# Patient Record
Sex: Female | Born: 1960 | Race: White | Hispanic: Yes | Smoking: Former smoker
Health system: Southern US, Community
[De-identification: ages and names within clinical notes are randomized; demographics above are authoritative.]

## PROBLEM LIST (undated history)

## (undated) DIAGNOSIS — R002 Palpitations: Secondary | ICD-10-CM

## (undated) DIAGNOSIS — E785 Hyperlipidemia, unspecified: Secondary | ICD-10-CM

## (undated) DIAGNOSIS — E039 Hypothyroidism, unspecified: Secondary | ICD-10-CM

## (undated) DIAGNOSIS — K219 Gastro-esophageal reflux disease without esophagitis: Secondary | ICD-10-CM

## (undated) DIAGNOSIS — I10 Essential (primary) hypertension: Secondary | ICD-10-CM

## (undated) HISTORY — PX: DILATION AND CURETTAGE OF UTERUS: SHX78

## (undated) HISTORY — PX: OTHER SURGICAL HISTORY: SHX169

## (undated) HISTORY — PX: TUBAL LIGATION: SHX77

## (undated) HISTORY — DX: Hyperlipidemia, unspecified: E78.5

---

## 2005-01-15 ENCOUNTER — Encounter: Admission: RE | Admit: 2005-01-15 | Discharge: 2005-01-15 | Payer: Self-pay | Admitting: Family Medicine

## 2005-01-24 ENCOUNTER — Ambulatory Visit: Payer: Self-pay | Admitting: Internal Medicine

## 2005-02-16 ENCOUNTER — Ambulatory Visit: Payer: Self-pay | Admitting: Internal Medicine

## 2007-02-21 ENCOUNTER — Encounter: Admission: RE | Admit: 2007-02-21 | Discharge: 2007-02-21 | Payer: Self-pay | Admitting: Family Medicine

## 2007-05-12 ENCOUNTER — Ambulatory Visit (HOSPITAL_COMMUNITY): Admission: RE | Admit: 2007-05-12 | Discharge: 2007-05-12 | Payer: Self-pay | Admitting: Family Medicine

## 2008-01-22 ENCOUNTER — Ambulatory Visit (HOSPITAL_COMMUNITY): Admission: RE | Admit: 2008-01-22 | Discharge: 2008-01-22 | Payer: Self-pay | Admitting: Obstetrics & Gynecology

## 2008-01-22 ENCOUNTER — Encounter (INDEPENDENT_AMBULATORY_CARE_PROVIDER_SITE_OTHER): Payer: Self-pay | Admitting: Obstetrics & Gynecology

## 2008-06-02 ENCOUNTER — Encounter: Admission: RE | Admit: 2008-06-02 | Discharge: 2008-06-02 | Payer: Self-pay | Admitting: Family Medicine

## 2008-10-25 ENCOUNTER — Emergency Department (HOSPITAL_COMMUNITY): Admission: EM | Admit: 2008-10-25 | Discharge: 2008-10-25 | Payer: Self-pay | Admitting: Emergency Medicine

## 2009-09-28 ENCOUNTER — Encounter: Admission: RE | Admit: 2009-09-28 | Discharge: 2009-09-28 | Payer: Self-pay | Admitting: Family Medicine

## 2009-11-07 ENCOUNTER — Telehealth (INDEPENDENT_AMBULATORY_CARE_PROVIDER_SITE_OTHER): Payer: Self-pay | Admitting: *Deleted

## 2009-11-07 ENCOUNTER — Encounter (INDEPENDENT_AMBULATORY_CARE_PROVIDER_SITE_OTHER): Payer: Self-pay | Admitting: *Deleted

## 2010-05-02 NOTE — Progress Notes (Signed)
  Phone Note Outgoing Call   Call placed by: Clide Cliff RN,  November 07, 2009 12:28 PM Details for Reason: No show for Previsit Summary of Call: Attempted to call pt. regarding her NOS of the previsit appointment scheduled for 8am this morning.  The phone had no ID, thus no message was left.   Letter to be mailed out this afternoon.   Colonoscopy cancelled. Initial call taken by: Clide Cliff RN,  November 07, 2009 12:29 PM

## 2010-05-02 NOTE — Letter (Signed)
Summary: Pre Visit No Show Letter  [facility]  [address] [place], [place] [address]   Phone: [phone]  Fax: [phone]        November 07, 2009 MRN: [id number]    Destiny Goodwin [address] [place], [place]  [address]    Dear Ms. Bala,   We have been unable to reach you by phone concerning the pre-procedure visit that you missed on___8/8/2011________________. For this reason,your procedure scheduled on_8/17/11___________ has been cancelled. Our scheduling staff will gladly assist you with rescheduling your appointments at a more convenient time. Please call our office at [phone] between the hours of 8:00am and 5:00pm, press option #2 to reach an appointment scheduler. Please consider updating your contact numbers at this time so that we can reach you by phone in the future with schedule changes or results.    Thank you,    Clide Cliff RN [facility]

## 2010-08-15 NOTE — Op Note (Signed)
NAMEAYDEE, MCNEW             ACCOUNT NO.:  [id number]   MEDICAL RECORD NO.:  [id number]          PATIENT TYPE:  AMB   LOCATION:  SDC                           FACILITY:  [facility]   PHYSICIAN:  Gerrit Friends. Aldona Bar, M.D.   DATE OF BIRTH:  December 10, 1960   DATE OF PROCEDURE:  01/22/2008  DATE OF DISCHARGE:                               OPERATIVE REPORT   PREOPERATIVE DIAGNOSES:  Abnormal uterine bleeding, fibroid uterus,  polyps in endometrial cavity - suggest an ultrasound.   POSTOPERATIVE DIAGNOSES:  Abnormal uterine bleeding, fibroid uterus,  polyps in endometrial cavity - suggest an ultrasound.   PATHOLOGY:  Pending.   PROCEDURE:  Dilatation and curettage.   SURGEON:  Gerrit Friends. Aldona Bar, MD   ANESTHESIA:  Intravenous conscious sedation plus paracervical block with  1% Xylocaine without epinephrine.   HISTORY:  This 50 year old patient was seen by me for the first time in  the office in September 2009 with complaints of abnormal bleeding.  A  sonohystogram done in February 2009 revealed that the patient had  endometrial polyps in addition to approximately 14 weeks size fibroid  uterus.   The patient is being taken to the operating at this point for dilatation  and curettage to evacuate the endometrial cavity of polyps and look for  abnormal histology.   PROCEDURE:  The patient was taken to the operating where after  satisfactory induction of intravenous conscious sedation, she was  prepped and draped having placed in the modified lithotomy position in  the short Allen stirrups.  She was prepped and draped accordingly and  the bladder was drained of clear urine red rubber catheter in-and-out  fashion.  At this time, a speculum was placed in the vagina and a  paracervical block carried out with approximately 14 mL of 1% Xylocaine  without epinephrine.  Thereafter, the internal os was dilated to a #31  Pratt dilator without difficulty.  The cavity sounded to 12 cm.  Using  the polyp  forceps polypoid like tissue was produced with probing of the  cavity.  Thereafter using a small serrated curette, the cavity was  thoroughly gently and systematically curetted and all specimen was sent  labeled endometrial curettings.   Further probing of the cavity revealed no additional tissue.  At this  time, the procedure was felt to be complete was terminated.  All  instruments were removed and the patient was transported to the recovery  room in satisfactory condition having tolerated the procedure well.  Looking grossly the specimen, there are at least two endometrial polyps  noted.  All was sent labeled appropriately.   The patient will be discharged to home after observation in the PACU  with instructions to return to the office in 3-4 weeks' time.  She will  use Advil or Aleve as needed for cramping and will be given an  instruction sheet at the time of discharge.   Condition on arrival in recovery satisfactory.      Gerrit Friends. Aldona Bar, M.D.  Electronically Signed     RMW/MEDQ  D:  01/22/2008  T:  01/22/2008  Job:  [id number] 

## 2010-08-23 ENCOUNTER — Other Ambulatory Visit (HOSPITAL_COMMUNITY)
Admission: RE | Admit: 2010-08-23 | Discharge: 2010-08-23 | Disposition: A | Discharge status: Home / Self Care | Payer: Self-pay | Source: Ambulatory Visit | Attending: Obstetrics and Gynecology | Admitting: Obstetrics and Gynecology

## 2010-08-23 ENCOUNTER — Other Ambulatory Visit: Payer: Self-pay | Admitting: Obstetrics and Gynecology

## 2010-08-23 DIAGNOSIS — Z124 Encounter for screening for malignant neoplasm of cervix: Secondary | ICD-10-CM | POA: Insufficient documentation

## 2010-10-10 ENCOUNTER — Other Ambulatory Visit: Payer: Self-pay | Admitting: Obstetrics and Gynecology

## 2010-10-24 ENCOUNTER — Encounter (HOSPITAL_COMMUNITY): Payer: Self-pay

## 2010-10-24 ENCOUNTER — Other Ambulatory Visit: Payer: Self-pay

## 2010-10-24 ENCOUNTER — Encounter (HOSPITAL_COMMUNITY)
Admission: RE | Admit: 2010-10-24 | Discharge: 2010-10-24 | Disposition: A | Discharge status: Home / Self Care | Payer: Commercial Managed Care - PPO | Source: Ambulatory Visit | Attending: Obstetrics and Gynecology | Admitting: Obstetrics and Gynecology

## 2010-10-24 HISTORY — DX: Hypothyroidism, unspecified: E03.9

## 2010-10-24 HISTORY — DX: Gastro-esophageal reflux disease without esophagitis: K21.9

## 2010-10-24 HISTORY — DX: Palpitations: R00.2

## 2010-10-24 LAB — CBC
HCT: 34.4 % — ABNORMAL LOW (ref 36.0–46.0)
Hemoglobin: 11.2 g/dL — ABNORMAL LOW (ref 12.0–15.0)
MCH: 25.2 pg — ABNORMAL LOW (ref 26.0–34.0)
MCHC: 32.6 g/dL (ref 30.0–36.0)
MCV: 77.5 fL — ABNORMAL LOW (ref 78.0–100.0)
Platelets: 359 10*3/uL (ref 150–400)
RBC: 4.44 MIL/uL (ref 3.87–5.11)
RDW: 16.2 % — ABNORMAL HIGH (ref 11.5–15.5)
WBC: 7.8 10*3/uL (ref 4.0–10.5)

## 2010-10-24 LAB — BASIC METABOLIC PANEL
BUN: 13 mg/dL (ref 6–23)
CO2: 24 mEq/L (ref 19–32)
Calcium: 8.4 mg/dL (ref 8.4–10.5)
Creatinine, Ser: 0.55 mg/dL (ref 0.50–1.10)
GFR calc Af Amer: >60 mL/min (ref >60)
GFR calc non Af Amer: >60 mL/min (ref >60)
Glucose, Bld: 89 mg/dL (ref 70–99)
Potassium: 4.3 mEq/L (ref 3.5–5.1)

## 2010-10-24 LAB — SURGICAL PCR SCREEN: Staphylococcus aureus: NEGATIVE

## 2010-10-24 NOTE — Patient Instructions (Signed)
20 Holle Sprick  10/24/2010   Your procedure is scheduled on:  11/01/10  Report to [facility] at 0800 AM.  Call this number if you have problems the morning of surgery: [phone]   Remember: bring inhaler to hospital   Do not eat food:After Midnight.  Do not drink clear liquids: After Midnight.  Take these medicines the morning of surgery with A SIP OF WATER: none   Do not wear jewelry, make-up or nail polish.  Do not bring valuables to the hospital.  Contacts, dentures or bridgework may not be worn into surgery.  Leave suitcase in the car. After surgery it may be brought to your room.  For patients admitted to the hospital, checkout time is 11:00 AM the day of discharge.   Patients discharged the day of surgery will not be allowed to drive home.  Name and phone number of your driver: Fredrik Cove- [phone]   Special Instructions: none  Please read over the following fact sheets that you were given: none

## 2010-10-24 NOTE — Anesthesia Preprocedure Evaluation (Addendum)
Anesthesia Evaluation  Name, MR# and DOB Patient awake  General Assessment Comment  Reviewed: Allergy & Precautions, H&P , Patient's Chart, lab work & pertinent test results and reviewed documented beta blocker date and time   Airway Mallampati: II TM Distance: <3 FB Neck ROM: Full    Dental  (+) Teeth Intact   Pulmonary  asthma (no inhaler use since March)  clear to auscultation    Cardiovascular hypertension, Pt. on medications Regular Normal Complaining of left-sided chest pain now in short stay.  No SOB, no diaphoresis.  Spo2 98% on RA, pulse 57, BP 117/96.  Will get EKG and assess.Complaining of left-sided chest pain now in short stay.  No SOB, no diaphoresis.  Spo2 98% on RA, pulse 57, BP 117/96.  Will get EKG and assess.:    Neuro/Psych (+) {AN ROS/MED HX NEURO HEADACHES (+) PSYCHIATRIC DISORDERS, Anxiety, Depression,   GI/Hepatic/Renal (+)  GERD      Endo/Other   (+)  Hypothyroidism (on synthroid),  Abdominal   Musculoskeletal  Hematology   Peds  Reproductive/Obstetrics   Anesthesia Other Findings             Anesthesia Physical Anesthesia Plan  ASA: III  Anesthesia Plan: General   Post-op Pain Management:    Induction: Intravenous  Airway Management Planned: Oral ETT  Additional Equipment:   Intra-op Plan:   Post-operative Plan:   Informed Consent: I have reviewed the patients History and Physical, chart, labs and discussed the procedure including the risks, benefits and alternatives for the proposed anesthesia with the patient or authorized representative who has indicated his/her understanding and acceptance.   Dental advisory given  Plan Discussed with: Surgeon and CRNA  Anesthesia Plan Comments: (Began to complain of left-sided chest pain in short stay.  EKG obtained.  EKG is SB at 55.  No evidence of ischemia.  Patient notes pain is in a specific area of muscle and occurs with  movement.  Is not assoc with SOB, N/V.  VSS.  EKG unchanged from prior on 7/24 except for slower rate.  WIll proceed with surgery.  Jasmine December, MD)       Anesthesia Quick Evaluation

## 2010-11-01 ENCOUNTER — Encounter (HOSPITAL_COMMUNITY)
Admission: RE | Disposition: A | Discharge status: Home / Self Care | Payer: Self-pay | Source: Ambulatory Visit | Attending: Obstetrics and Gynecology

## 2010-11-01 ENCOUNTER — Ambulatory Visit (HOSPITAL_COMMUNITY)
Admission: RE | Admit: 2010-11-01 | Discharge: 2010-11-03 | DRG: 743 | Disposition: A | Discharge status: Home / Self Care | Payer: Commercial Managed Care - PPO | Source: Ambulatory Visit | Attending: Obstetrics and Gynecology | Admitting: Obstetrics and Gynecology

## 2010-11-01 ENCOUNTER — Other Ambulatory Visit: Payer: Self-pay | Admitting: Obstetrics and Gynecology

## 2010-11-01 ENCOUNTER — Encounter (HOSPITAL_COMMUNITY): Payer: Self-pay | Admitting: Registered Nurse

## 2010-11-01 ENCOUNTER — Inpatient Hospital Stay (HOSPITAL_COMMUNITY): Payer: Commercial Managed Care - PPO | Admitting: Anesthesiology

## 2010-11-01 ENCOUNTER — Other Ambulatory Visit: Payer: Self-pay

## 2010-11-01 ENCOUNTER — Encounter (HOSPITAL_COMMUNITY): Payer: Self-pay | Admitting: Anesthesiology

## 2010-11-01 DIAGNOSIS — N393 Stress incontinence (female) (male): Secondary | ICD-10-CM | POA: Diagnosis present

## 2010-11-01 DIAGNOSIS — Z01818 Encounter for other preprocedural examination: Secondary | ICD-10-CM

## 2010-11-01 DIAGNOSIS — N8000 Endometriosis of the uterus, unspecified: Secondary | ICD-10-CM | POA: Diagnosis present

## 2010-11-01 DIAGNOSIS — D251 Intramural leiomyoma of uterus: Principal | ICD-10-CM | POA: Diagnosis present

## 2010-11-01 DIAGNOSIS — N838 Other noninflammatory disorders of ovary, fallopian tube and broad ligament: Secondary | ICD-10-CM | POA: Diagnosis present

## 2010-11-01 DIAGNOSIS — N8300 Follicular cyst of ovary, unspecified side: Secondary | ICD-10-CM | POA: Diagnosis present

## 2010-11-01 DIAGNOSIS — Z01812 Encounter for preprocedural laboratory examination: Secondary | ICD-10-CM

## 2010-11-01 HISTORY — PX: ABDOMINAL HYSTERECTOMY: SHX81

## 2010-11-01 HISTORY — PX: BURCH PROCEDURE: SHX1273

## 2010-11-01 SURGERY — HYSTERECTOMY, ABDOMINAL
Anesthesia: General | Site: Bladder | Wound class: Clean Contaminated

## 2010-11-01 MED ORDER — DEXAMETHASONE SODIUM PHOSPHATE 10 MG/ML IJ SOLN
INTRAMUSCULAR | Status: AC
  Filled 2010-11-01: qty 1

## 2010-11-01 MED ORDER — INDIGOTINDISULFONATE SODIUM 8 MG/ML IJ SOLN
INTRAMUSCULAR | Status: AC
  Filled 2010-11-01: qty 5

## 2010-11-01 MED ORDER — ALUM & MAG HYDROXIDE-SIMETH 200-200-20 MG/5ML PO SUSP: 30.0000 mL | ORAL | Status: DC | PRN

## 2010-11-01 MED ORDER — MIDAZOLAM HCL 2 MG/2ML IJ SOLN
INTRAMUSCULAR | Status: AC
  Filled 2010-11-01: qty 2

## 2010-11-01 MED ORDER — HYDROMORPHONE HCL 1 MG/ML IJ SOLN
INTRAMUSCULAR | Status: DC | PRN
  Administered 2010-11-01: 1 mg via INTRAVENOUS

## 2010-11-01 MED ORDER — NEOSTIGMINE METHYLSULFATE 1 MG/ML IJ SOLN
INTRAMUSCULAR | Status: DC | PRN
  Administered 2010-11-01: 2 mg via INTRAMUSCULAR

## 2010-11-01 MED ORDER — LACTATED RINGERS IV SOLN
INTRAVENOUS | Status: DC
Start: 2010-11-01 — End: 2010-11-01
  Administered 2010-11-01 (×3): via INTRAVENOUS

## 2010-11-01 MED ORDER — FENTANYL CITRATE 0.05 MG/ML IJ SOLN
INTRAMUSCULAR | Status: AC
  Filled 2010-11-01: qty 5

## 2010-11-01 MED ORDER — NEOSTIGMINE METHYLSULFATE 1 MG/ML IJ SOLN
INTRAMUSCULAR | Status: AC
  Filled 2010-11-01: qty 10

## 2010-11-01 MED ORDER — ACETAMINOPHEN 325 MG PO TABS: 325.0000 mg | ORAL_TABLET | ORAL | Status: DC | PRN

## 2010-11-01 MED ORDER — CITALOPRAM HYDROBROMIDE 10 MG PO TABS
10.0000 mg | ORAL_TABLET | Freq: Every day | ORAL | Status: DC
  Filled 2010-11-01 (×2): qty 1

## 2010-11-01 MED ORDER — KCL-LACTATED RINGERS-D5W 20 MEQ/L IV SOLN
INTRAVENOUS | Status: DC
  Filled 2010-11-01 (×2): qty 1000

## 2010-11-01 MED ORDER — INDIGOTINDISULFONATE SODIUM 8 MG/ML IJ SOLN
INTRAMUSCULAR | Status: DC | PRN
  Administered 2010-11-01: 5 mL via INTRAVENOUS

## 2010-11-01 MED ORDER — SIMETHICONE 80 MG PO CHEW: 80.0000 mg | CHEWABLE_TABLET | Freq: Four times a day (QID) | ORAL | Status: DC | PRN

## 2010-11-01 MED ORDER — PROPOFOL 10 MG/ML IV EMUL
INTRAVENOUS | Status: AC
  Filled 2010-11-01: qty 20

## 2010-11-01 MED ORDER — GLYCOPYRROLATE 0.2 MG/ML IJ SOLN
INTRAMUSCULAR | Status: DC | PRN
  Administered 2010-11-01: .4 mg via INTRAVENOUS

## 2010-11-01 MED ORDER — GLYCOPYRROLATE 0.2 MG/ML IJ SOLN
INTRAMUSCULAR | Status: AC
  Filled 2010-11-01: qty 2

## 2010-11-01 MED ORDER — MORPHINE SULFATE 4 MG/ML IJ SOLN
1.0000 mg | INTRAMUSCULAR | Status: DC | PRN
  Administered 2010-11-01 – 2010-11-02 (×2): 2 mg via INTRAVENOUS
  Filled 2010-11-01 (×2): qty 1

## 2010-11-01 MED ORDER — LIDOCAINE HCL (CARDIAC) 20 MG/ML IV SOLN
INTRAVENOUS | Status: DC | PRN
  Administered 2010-11-01: 60 mg via INTRAVENOUS

## 2010-11-01 MED ORDER — ALBUTEROL SULFATE HFA 108 (90 BASE) MCG/ACT IN AERS
2.0000 | INHALATION_SPRAY | Freq: Four times a day (QID) | RESPIRATORY_TRACT | Status: DC | PRN
  Filled 2010-11-01: qty 6.7

## 2010-11-01 MED ORDER — FENTANYL CITRATE 0.05 MG/ML IJ SOLN
INTRAMUSCULAR | Status: DC | PRN
  Administered 2010-11-01: 50 ug via INTRAVENOUS
  Administered 2010-11-01 (×2): 100 ug via INTRAVENOUS
  Administered 2010-11-01: 50 ug via INTRAVENOUS
  Administered 2010-11-01 (×2): 100 ug via INTRAVENOUS

## 2010-11-01 MED ORDER — ONDANSETRON HCL 4 MG/2ML IJ SOLN
INTRAMUSCULAR | Status: AC
  Filled 2010-11-01: qty 2

## 2010-11-01 MED ORDER — ZOLPIDEM TARTRATE 5 MG PO TABS: 5.0000 mg | ORAL_TABLET | Freq: Every evening | ORAL | Status: DC | PRN

## 2010-11-01 MED ORDER — ONDANSETRON HCL 4 MG/2ML IJ SOLN
INTRAMUSCULAR | Status: DC | PRN
  Administered 2010-11-01: 4 mg via INTRAVENOUS

## 2010-11-01 MED ORDER — MEPERIDINE HCL 25 MG/ML IJ SOLN: 6.2500 mg | INTRAMUSCULAR | Status: DC | PRN

## 2010-11-01 MED ORDER — ROCURONIUM BROMIDE 50 MG/5ML IV SOLN
INTRAVENOUS | Status: AC
  Filled 2010-11-01: qty 1

## 2010-11-01 MED ORDER — KETOROLAC TROMETHAMINE 30 MG/ML IJ SOLN
30.0000 mg | Freq: Four times a day (QID) | INTRAMUSCULAR | Status: DC
  Administered 2010-11-03: 30 mg via INTRAMUSCULAR
  Filled 2010-11-01 (×2): qty 1

## 2010-11-01 MED ORDER — DEXTROSE IN LACTATED RINGERS 5 % IV SOLN
INTRAVENOUS | Status: DC
  Administered 2010-11-01 – 2010-11-02 (×3): via INTRAVENOUS

## 2010-11-01 MED ORDER — FENTANYL CITRATE 0.05 MG/ML IJ SOLN
INTRAMUSCULAR | Status: AC
  Administered 2010-11-01: 50 ug via INTRAVENOUS
  Filled 2010-11-01: qty 2

## 2010-11-01 MED ORDER — BISACODYL 5 MG PO TBEC
5.0000 mg | DELAYED_RELEASE_TABLET | Freq: Every day | ORAL | Status: DC | PRN
  Administered 2010-11-01: 5 mg via ORAL
  Filled 2010-11-01: qty 1

## 2010-11-01 MED ORDER — PROPOFOL 10 MG/ML IV EMUL
INTRAVENOUS | Status: DC | PRN
  Administered 2010-11-01: 150 mg via INTRAVENOUS
  Administered 2010-11-01: 50 mg via INTRAVENOUS

## 2010-11-01 MED ORDER — DEXAMETHASONE SODIUM PHOSPHATE 10 MG/ML IJ SOLN
INTRAMUSCULAR | Status: DC | PRN
  Administered 2010-11-01: 10 mg via INTRAVENOUS

## 2010-11-01 MED ORDER — CIPROFLOXACIN HCL 500 MG PO TABS
500.0000 mg | ORAL_TABLET | Freq: Two times a day (BID) | ORAL | Status: DC
  Administered 2010-11-01 – 2010-11-03 (×4): 500 mg via ORAL
  Filled 2010-11-01 (×5): qty 1

## 2010-11-01 MED ORDER — CITALOPRAM HYDROBROMIDE 10 MG PO TABS
5.0000 mg | ORAL_TABLET | Freq: Every day | ORAL | Status: DC
  Administered 2010-11-01 – 2010-11-02 (×2): 5 mg via ORAL
  Filled 2010-11-01 (×3): qty 1

## 2010-11-01 MED ORDER — LACTATED RINGERS IV SOLN
INTRAVENOUS | Status: DC
  Administered 2010-11-01: 15:00:00 via INTRAVENOUS

## 2010-11-01 MED ORDER — LEVOTHYROXINE SODIUM 50 MCG PO TABS
50.0000 ug | ORAL_TABLET | Freq: Every day | ORAL | Status: DC
  Administered 2010-11-02 – 2010-11-03 (×2): 50 ug via ORAL
  Filled 2010-11-01 (×2): qty 1

## 2010-11-01 MED ORDER — POTASSIUM CHLORIDE 2 MEQ/ML IV SOLN
INTRAVENOUS | Status: DC
  Filled 2010-11-01 (×2): qty 1000

## 2010-11-01 MED ORDER — MECLIZINE HCL 25 MG PO TABS
25.0000 mg | ORAL_TABLET | Freq: Three times a day (TID) | ORAL | Status: DC
  Administered 2010-11-02 – 2010-11-03 (×4): 25 mg via ORAL
  Filled 2010-11-01 (×6): qty 1

## 2010-11-01 MED ORDER — KETOROLAC TROMETHAMINE 30 MG/ML IJ SOLN
30.0000 mg | Freq: Four times a day (QID) | INTRAMUSCULAR | Status: DC
  Administered 2010-11-01 – 2010-11-02 (×6): 30 mg via INTRAVENOUS
  Filled 2010-11-01 (×5): qty 1

## 2010-11-01 MED ORDER — SENNOSIDES-DOCUSATE SODIUM 8.6-50 MG PO TABS: 2.0000 | ORAL_TABLET | Freq: Every day | ORAL | Status: DC | PRN

## 2010-11-01 MED ORDER — KCL-LACTATED RINGERS-D5W 20 MEQ/L IV SOLN: INTRAVENOUS | Status: DC

## 2010-11-01 MED ORDER — MORPHINE SULFATE 2 MG/ML IJ SOLN: 1.0000 mg | INTRAMUSCULAR | Status: DC | PRN

## 2010-11-01 MED ORDER — DEXTROSE 5 % IV SOLN
1.0000 g | INTRAVENOUS | Status: AC
  Administered 2010-11-01: 1 g via INTRAVENOUS
  Filled 2010-11-01: qty 1

## 2010-11-01 MED ORDER — MIDAZOLAM HCL 5 MG/5ML IJ SOLN
INTRAMUSCULAR | Status: DC | PRN
  Administered 2010-11-01: 2 mg via INTRAVENOUS

## 2010-11-01 MED ORDER — HYDROMORPHONE HCL 1 MG/ML IJ SOLN
INTRAMUSCULAR | Status: AC
  Filled 2010-11-01: qty 1

## 2010-11-01 MED ORDER — ROCURONIUM BROMIDE 100 MG/10ML IV SOLN
INTRAVENOUS | Status: DC | PRN
  Administered 2010-11-01: 10 mg via INTRAVENOUS
  Administered 2010-11-01: 30 mg via INTRAVENOUS
  Administered 2010-11-01 (×3): 10 mg via INTRAVENOUS

## 2010-11-01 MED ORDER — LOSARTAN POTASSIUM 50 MG PO TABS
50.0000 mg | ORAL_TABLET | Freq: Every day | ORAL | Status: DC
  Administered 2010-11-01 – 2010-11-02 (×2): 50 mg via ORAL
  Filled 2010-11-01 (×2): qty 1

## 2010-11-01 MED ORDER — FENTANYL CITRATE 0.05 MG/ML IJ SOLN
25.0000 ug | INTRAMUSCULAR | Status: DC | PRN
  Administered 2010-11-01 (×2): 50 ug via INTRAVENOUS

## 2010-11-01 MED ORDER — METOCLOPRAMIDE HCL 5 MG/ML IJ SOLN
10.0000 mg | Freq: Once | INTRAMUSCULAR | Status: AC | PRN
  Administered 2010-11-01: 10 mg via INTRAVENOUS
  Filled 2010-11-01: qty 2

## 2010-11-01 MED ORDER — LIDOCAINE HCL (CARDIAC) 20 MG/ML IV SOLN
INTRAVENOUS | Status: AC
  Filled 2010-11-01: qty 5

## 2010-11-01 SURGICAL SUPPLY — 45 items
APL SKNCLS STERI-STRIP NONHPOA (GAUZE/BANDAGES/DRESSINGS) ×2
BENZOIN TINCTURE PRP APPL 2/3 (GAUZE/BANDAGES/DRESSINGS) ×1 IMPLANT
CANISTER SUCTION 2500CC (MISCELLANEOUS) ×3 IMPLANT
CHLORAPREP W/TINT 26ML (MISCELLANEOUS) ×3 IMPLANT
CLOTH BEACON ORANGE TIMEOUT ST (SAFETY) ×3 IMPLANT
CONT PATH 16OZ SNAP LID 3702 (MISCELLANEOUS) ×3 IMPLANT
DISSECTOR SPONGE CHERRY (GAUZE/BANDAGES/DRESSINGS) IMPLANT
DRAPE UTILITY XL STRL (DRAPES) ×3 IMPLANT
DRESSING TELFA 8X3 (GAUZE/BANDAGES/DRESSINGS) ×1 IMPLANT
GAUZE SPONGE 4X4 12PLY STRL LF (GAUZE/BANDAGES/DRESSINGS) ×1 IMPLANT
GAUZE SPONGE 4X4 16PLY XRAY LF (GAUZE/BANDAGES/DRESSINGS) ×3 IMPLANT
GLOVE BIO SURGEON STRL SZ7 (GLOVE) ×3 IMPLANT
GLOVE BIOGEL PI IND STRL 7.0 (GLOVE) ×4 IMPLANT
GLOVE BIOGEL PI IND STRL 8 (GLOVE) IMPLANT
GLOVE BIOGEL PI INDICATOR 7.0 (GLOVE) ×2
GLOVE BIOGEL PI INDICATOR 8 (GLOVE) ×1
GOWN PREVENTION PLUS LG XLONG (DISPOSABLE) ×6 IMPLANT
GOWN PREVENTION PLUS XLARGE (GOWN DISPOSABLE) ×3 IMPLANT
NS IRRIG 1000ML POUR BTL (IV SOLUTION) ×3 IMPLANT
PACK ABDOMINAL GYN (CUSTOM PROCEDURE TRAY) ×3 IMPLANT
PAD ABD 7.5X8 STRL (GAUZE/BANDAGES/DRESSINGS) ×1 IMPLANT
PAD OB MATERNITY 4.3X12.25 (PERSONAL CARE ITEMS) ×3 IMPLANT
SET CYSTO W/LG BORE CLAMP LF (SET/KITS/TRAYS/PACK) ×1 IMPLANT
SHEET LAVH (DRAPES) ×1 IMPLANT
SLEEVE SURGEON STRL (DRAPES) ×1 IMPLANT
SPONGE LAP 18X18 X RAY DECT (DISPOSABLE) ×6 IMPLANT
STAPLER VISISTAT 35W (STAPLE) ×3 IMPLANT
STRIP CLOSURE SKIN 1/4X4 (GAUZE/BANDAGES/DRESSINGS) ×1 IMPLANT
SUT CHROMIC 2 0 CT 1 (SUTURE) ×3 IMPLANT
SUT PDS AB 1 CT  36 (SUTURE) ×2
SUT PDS AB 1 CT 36 (SUTURE) IMPLANT
SUT PLAIN 2 0 (SUTURE) ×3
SUT PLAIN 2 0 XLH (SUTURE) ×1 IMPLANT
SUT PLAIN ABS 2-0 CT1 27XMFL (SUTURE) IMPLANT
SUT VIC AB 0 CT1 18XCR BRD8 (SUTURE) ×8 IMPLANT
SUT VIC AB 0 CT1 27 (SUTURE) ×18
SUT VIC AB 0 CT1 27XBRD ANBCTR (SUTURE) IMPLANT
SUT VIC AB 0 CT1 36 (SUTURE) ×6 IMPLANT
SUT VIC AB 0 CT1 8-18 (SUTURE) ×15
SUT VIC AB 2-0 CT1 (SUTURE) ×6 IMPLANT
SUT VIC AB 4-0 KS 27 (SUTURE) IMPLANT
SUT VICRYL 0 TIES 12 18 (SUTURE) ×3 IMPLANT
TOWEL OR 17X24 6PK STRL BLUE (TOWEL DISPOSABLE) ×6 IMPLANT
TRAY FOLEY CATH 14FR (SET/KITS/TRAYS/PACK) ×3 IMPLANT
WATER STERILE IRR 1000ML POUR (IV SOLUTION) ×3 IMPLANT

## 2010-11-01 NOTE — Anesthesia Postprocedure Evaluation (Signed)
  Anesthesia Post-op Note  Patient: Destiny Goodwin  Procedure(s) Performed:  HYSTERECTOMY ABDOMINAL - Cystoscopy; BURCH PROCEDURE  No anesthesia complications.  Level of consciousness: alert. Cardiopulmonary status stable.  No follow-up care or observation required.  Quianna Avery L. Rodman Pickle, MD

## 2010-11-01 NOTE — Progress Notes (Signed)
On at 955

## 2010-11-01 NOTE — Brief Op Note (Signed)
11/01/2010  3:16 PM  PATIENT:  Quenten Raven  50 y.o. female  PRE-OPERATIVE DIAGNOSIS:  Fibroids;Urinary Stress Incontinence  POST-OPERATIVE DIAGNOSIS:  Fibroids;Urinary Stress Incotinence  PROCEDURE:  Procedure(s): HYSTERECTOMY ABDOMINAL, RSO, LAD BURCH PROCEDURE, cystoscopy  SURGEON:  Surgeon(s): Fortino Sic, MD Mady Haagensen, MD  PHYSICIAN ASSISTANT:   ASSISTANTS: Liberty Handy, MD   ANESTHESIA:   general  ESTIMATED BLOOD LOSS: 200 cc   BLOOD ADMINISTERED:none  DRAINS: Urinary Catheter (Foley)   LOCAL MEDICATIONS USED:  NONE  SPECIMEN:  Source of Specimen:  uterus, cervix, right overy  DISPOSITION OF SPECIMEN:  PATHOLOGY  COUNTS:  YES  TOURNIQUET: * No tourniquets in log *  DICTATION #: [id number]  PLAN OF CARE:  Admit  PATIENT DISPOSITION:  PACU - hemodynamically stable.   Delay start of Pharmacological VTE agent (>24hrs) due to surgical blood loss or risk of bleeding: /NOT APPLICABLE:20182}

## 2010-11-01 NOTE — Transfer of Care (Signed)
Immediate Anesthesia Transfer of Care Note  Patient: Destiny Goodwin  Procedure(s) Performed:  HYSTERECTOMY ABDOMINAL - Cystoscopy; BURCH PROCEDURE  Patient Location: PACU  Anesthesia Type: General  Level of Consciousness: oriented and sedated  Airway & Oxygen Therapy: Patient Spontanous Breathing and Patient connected to nasal cannula oxygen  Post-op Assessment: Report given to PACU RN, Post -op Vital signs reviewed and stable and Patient moving all extremities  Post vital signs: Reviewed and stable  Complications: No apparent anesthesia complications

## 2010-11-02 ENCOUNTER — Encounter (HOSPITAL_COMMUNITY): Payer: Self-pay | Admitting: Obstetrics and Gynecology

## 2010-11-02 ENCOUNTER — Encounter (HOSPITAL_COMMUNITY): Payer: Self-pay | Admitting: Anesthesiology

## 2010-11-02 LAB — CBC
HCT: 29 % — ABNORMAL LOW (ref 36.0–46.0)
Hemoglobin: 9.2 g/dL — ABNORMAL LOW (ref 12.0–15.0)
MCV: 77.5 fL — ABNORMAL LOW (ref 78.0–100.0)
RBC: 3.74 MIL/uL — ABNORMAL LOW (ref 3.87–5.11)
RDW: 16.4 % — ABNORMAL HIGH (ref 11.5–15.5)
WBC: 16 10*3/uL — ABNORMAL HIGH (ref 4.0–10.5)

## 2010-11-02 MED ORDER — ONDANSETRON HCL 4 MG/2ML IJ SOLN: 4.0000 mg | INTRAMUSCULAR | Status: DC | PRN

## 2010-11-02 MED ORDER — LACTATED RINGERS IV SOLN
Freq: Once | INTRAVENOUS | Status: AC
  Administered 2010-11-02: 01:00:00 via INTRAVENOUS

## 2010-11-02 NOTE — Progress Notes (Signed)
1 Day Post-Op Procedure(s) (LRB): HYSTERECTOMY ABDOMINAL (N/A) BURCH PROCEDURE (N/A)  Subjective: Patient reports tolerating PO.    Objective: I have reviewed patient's vital signs, intake and output, medications and labs.  General: alert, cooperative and no distress  Assessment: s/p Procedure(s): HYSTERECTOMY ABDOMINAL BURCH PROCEDURE: stable  Plan: Advance diet  LOS: 1 day    Terisa Belardo E 11/02/2010, 1:23 PM

## 2010-11-02 NOTE — Anesthesia Postprocedure Evaluation (Signed)
  Anesthesia Post-op Note  Patient: Destiny Goodwin  Procedure(s) Performed:  HYSTERECTOMY ABDOMINAL - Cystoscopy; BURCH PROCEDURE  Patient Location: PACU and Women's Unit  Anesthesia Type: General  Level of Consciousness: awake, alert  and oriented  Airway and Oxygen Therapy: Patient Spontanous Breathing  Post-op Pain: mild  Post-op Assessment: Patient's Cardiovascular Status Stable and Respiratory Function Stable  Post-op Vital Signs: Reviewed  Complications: No apparent anesthesia complications

## 2010-11-02 NOTE — Op Note (Signed)
NAMECLARISSE, RODRIGES             ACCOUNT NO.:  [id number]  MEDICAL RECORD NO.:  [id number]  LOCATION:  9311                          FACILITY:  [facility]  PHYSICIAN:  Arlyce Harman, MD     DATE OF BIRTH:  08/29/1960  DATE OF PROCEDURE:  11/01/2010 DATE OF DISCHARGE:                              OPERATIVE REPORT   PREOPERATIVE DIAGNOSES: 1. Fibroids. 2. Urinary stress incontinence.  POSTOPERATIVE DIAGNOSES: 1. Fibroids. 2. Urinary stress incontinence. 3. Pelvic adhesions.  SURGEON:  Arlyce Harman, MD  ASSISTANT:  Liberty Handy, MD  PROCEDURES: 1. Total abdominal hysterectomy. 2. Right salpingo-oophorectomy. 3. Lysis of adhesions. 4. Burch procedure. 5. Cystoscopy.  INDICATIONS AND CONSENT:  The patient is a 50 year old gravida 2, para 2, who has a large pelvic mass.  Ultrasound is consistent with fibroid tumor.  In addition, she has complained of moderate urinary stress incontinence.  Because of the massive fibroid uterus, the patient has complained of pelvic pressure and discomfort and has requested surgical treatment.  Risks and possible complications were discussed and informed consent was given.  DESCRIPTION OF PROCEDURE:  The patient was placed on the table in a supine position.  General anesthesia was induced.  The abdomen was sterilely prepped and draped in the usual fashion.  The bladder was drained with a Foley catheter.  Because of the plan for the Burch procedure, the patient was transferred from a supine position to a lithotomy position and the thighs were descended to make room for the operative field.  The procedure was initiated by placing a midline incision into the abdomen, extending into the abdominal layers without difficulty.  The peritoneum was entered.  The uterus presented, it was large, consistent with fibroid uterus.  The bowel was packed away after the retractor was placed and a suture was placed in the fundus for traction.  The round  ligaments were identified and on the right, it was clamped.  The anterior and posterior leaf of the broad ligament was entered.  The round ligament was tied with 0 Vicryl suture, were all tied except otherwise noted.  The exact procedure was done on the other side.  The uterus was large and was twisted to the left and the ovary was adhesed to the fundus.  Using sharp and blunt dissection, we were able to lyse the adhesions off the uterus and isolated the infundibulopelvic ligament and then the ligament was clamped and cut. The pedicle was doubly tied and we worked our way down laterally close up to the uterus, clamping and tying until we reach the uterine vessels which were clamped and tied.  The left adnexal pedicle was clamped, leaving the left tube and ovary and then this pedicle was doubly tied and the procedure was continued to clamp, cut, and tied at the uterine vessels on the left side and we continued to work our way down, clamping, cutting and tying the cardinal ligaments.  At this point, the knife was used to transect the cervix and the fibroid fundus was handed off to markedly improve visibility.  A thyroid clamp was placed on the cervical stalk for traction and this was after the bladder flap had been reflected down carefully before  the cervix was transected.  We continued to work our way down and when we reached the cuff, 2 curved Haney were placed and the cuff was incised and the cervix was released and handed off.  Richardson angle stitches were placed bilaterally and the cuff was closed with a running locked suture.  At this point, the pelvis was irrigated and suctioned and the ureters were identified bilaterally and were peristalsing and because of the anatomy that has a tissue ports up out of the normal place, a decision was made to check for patency of the uterus when all procedures were completed.  At this point after the hysterectomy was completed, the abdomen was  irrigated and suctioned out. Hemostasis was obtained.  The peritoneum was closed with 2-0 Vicryl in a running fashion.    Next, we began the Burch procedure or the retropubic urethropexy.  We were able to the separate the tissue to see the Cooper's ligament and a gloved hand was placed in the vagina to identify the UV angle as the Foley was tugged down.  A suture was placed approximately 2 cm from the UV angle bilaterally.  Two bites were taken carefully not to involve the bladder wall. Tlaterally about 2-3cm and a slightly superior, 2 more stitches were placed and these were held.  At this point, the Cooper ligament was identified and a medial stitch was placed to the left of the patient's midline and the Cooper ligament and then another stitch was placed approximately 3 cm lateral to that.  We did exact procedure on the other side and the sutures were tied down and elevation of the bladder neck was rechecked and the UV angle was lifted nicely.  At this point, the gloved hand was removed and the glove was handed off and a sleeve was placed on the right arm.  The urine was checked and was clear and urine output was good.  At this point, cystoscopy was performed by Dr. Shearon Stalls.  A cystoscope with a 70- degree angle was used to enter the urethra and the ostia of the ureters opening into the bladder were checked.  Indigo carmine dye had been  injected by the Nurse Anesthesin and we were able to see a blue stream of dye ejected into the bladder bilaterally.  There were no other abnormalities noted of the bladder and no stitches from the urethropexy entered the bladder wall.  At this point, the cystoscope was removed and we went above. The peritoneum had previously been closed.  The perivesical fascia was irrigated and sponged out.  Hemostasis was good. The fascia was closed with PDS suture in a running fashion and the subcu was closed with a 2-0 plain in a running fashion and the skin  was approximated with a 4-0 Vicryl on a 4-0 Keith needle in a subcuticular fashion.  At this point, the procedure was terminated and the patient was awakened and taken to recovery.  Estimated blood loss was 200 mL.     Arlyce Harman, MD     EG/MEDQ  D:  11/01/2010  T:  11/02/2010  Job:  [id number]

## 2010-11-03 MED ORDER — BISACODYL 10 MG RE SUPP
10.0000 mg | Freq: Once | RECTAL | Status: AC
  Administered 2010-11-03: 10 mg via RECTAL
  Filled 2010-11-03: qty 1

## 2010-11-03 NOTE — Progress Notes (Signed)
2 Days Post-Op Procedure(s): HYSTERECTOMY ABDOMINAL BURCH PROCEDURE  Subjective: Patient reports tolerating PO, + flatus and + BM.    Objective: I have reviewed patient's vital signs, intake and output, medications and labs.  General: alert, cooperative and no distress Resp: clear to auscultation bilaterally Cardio: regular rate and rhythm, S1, S2 normal, no murmur, click, rub or gallop GI: soft, non-tender; bowel sounds normal; no masses,  no organomegaly Extremities: extremities normal, atraumatic, no cyanosis or edema Vaginal Bleeding: minimal  Assessment: s/p Procedure(s): HYSTERECTOMY ABDOMINAL BURCH PROCEDURE: stable  Plan: Discharge home  LOS: 2 days    Torence Palmeri E 11/03/2010, 8:10 AM

## 2010-11-03 NOTE — Discharge Summary (Signed)
Physician Discharge Summary  Patient ID: Maryuri Warnke MRN: [id number] DOB/AGE: [birth year] 50 y.o.  Admit date: 11/01/2010 Discharge date: 11/03/2010  Admission Diagnoses:  Discharge Diagnoses:  Active Problems:  * No active hospital problems. *    Discharged Condition: good  Hospital Course: uncomplicated  Consults: none  Significant Diagnostic Studies: labs: 9.2/29 /H/Hct  Treatments: IV hydration and analgesia: percocet  Discharge Exam: Blood pressure 117/70, pulse 63, temperature 98 F (36.7 C), temperature source Oral, resp. rate 20, SpO2 96.00%. General appearance: alert, cooperative and no distress Chest wall: no tenderness, lungs clear GI: soft, non-tender; bowel sounds normal; no masses,  no organomegaly Extremities: extremities normal, atraumatic, no cyanosis or edema Incision/Wound:  Disposition: Final discharge disposition not confirmed   Current Discharge Medication List    CONTINUE these medications which have NOT CHANGED   Details  benazepril (LOTENSIN) 20 MG tablet Take 20 mg by mouth daily.     ciprofloxacin (CIPRO) 500 MG tablet Take 500 mg by mouth 2 (two) times daily. Patient  Takes twice daily for 10 days. She is on day 4 today     citalopram (CELEXA) 10 MG tablet Take 5 mg by mouth daily.     levothyroxine (SYNTHROID, LEVOTHROID) 50 MCG tablet Take 50 mcg by mouth daily.      losartan (COZAAR) 50 MG tablet Take 50 mg by mouth daily.      meclizine (ANTIVERT) 25 MG tablet Take 25 mg by mouth every 8 (eight) hours.      Multiple Vitamins-Iron (MULTIVITAMIN/IRON PO) Take 1 tablet by mouth daily.      albuterol (PROVENTIL HFA;VENTOLIN HFA) 108 (90 BASE) MCG/ACT inhaler Inhale 2 puffs into the lungs every 6 (six) hours as needed. Shortness of breath          Signed: Fortino Sic 11/03/2010, 8:02 AM

## 2010-11-27 ENCOUNTER — Other Ambulatory Visit: Payer: Self-pay | Admitting: Obstetrics and Gynecology

## 2010-11-27 ENCOUNTER — Other Ambulatory Visit: Payer: Self-pay | Admitting: Family Medicine

## 2010-11-27 DIAGNOSIS — N959 Unspecified menopausal and perimenopausal disorder: Secondary | ICD-10-CM

## 2010-11-27 DIAGNOSIS — Z1231 Encounter for screening mammogram for malignant neoplasm of breast: Secondary | ICD-10-CM

## 2010-12-05 ENCOUNTER — Ambulatory Visit
Admission: RE | Admit: 2010-12-05 | Discharge: 2010-12-05 | Disposition: A | Discharge status: Home / Self Care | Payer: Commercial Managed Care - PPO | Source: Ambulatory Visit | Attending: Obstetrics and Gynecology | Admitting: Obstetrics and Gynecology

## 2010-12-05 DIAGNOSIS — Z1231 Encounter for screening mammogram for malignant neoplasm of breast: Secondary | ICD-10-CM

## 2010-12-05 DIAGNOSIS — N959 Unspecified menopausal and perimenopausal disorder: Secondary | ICD-10-CM

## 2010-12-11 ENCOUNTER — Other Ambulatory Visit (INDEPENDENT_AMBULATORY_CARE_PROVIDER_SITE_OTHER): Payer: Self-pay | Admitting: Otolaryngology

## 2010-12-11 DIAGNOSIS — R42 Dizziness and giddiness: Secondary | ICD-10-CM

## 2010-12-11 DIAGNOSIS — H918X9 Other specified hearing loss, unspecified ear: Secondary | ICD-10-CM

## 2010-12-14 ENCOUNTER — Ambulatory Visit
Admission: RE | Admit: 2010-12-14 | Discharge: 2010-12-14 | Disposition: A | Discharge status: Home / Self Care | Payer: Commercial Managed Care - PPO | Source: Ambulatory Visit | Attending: Otolaryngology | Admitting: Otolaryngology

## 2010-12-14 DIAGNOSIS — H918X9 Other specified hearing loss, unspecified ear: Secondary | ICD-10-CM

## 2010-12-14 DIAGNOSIS — R42 Dizziness and giddiness: Secondary | ICD-10-CM

## 2010-12-14 MED ORDER — GADOBENATE DIMEGLUMINE 529 MG/ML IV SOLN
15.0000 mL | Freq: Once | INTRAVENOUS | Status: AC | PRN
  Administered 2010-12-14: 15 mL via INTRAVENOUS

## 2010-12-22 ENCOUNTER — Emergency Department (HOSPITAL_COMMUNITY)
Admission: EM | Admit: 2010-12-22 | Discharge: 2010-12-22 | Disposition: A | Discharge status: Home / Self Care | Payer: Commercial Managed Care - PPO | Attending: Emergency Medicine | Admitting: Emergency Medicine

## 2010-12-22 DIAGNOSIS — R42 Dizziness and giddiness: Secondary | ICD-10-CM | POA: Insufficient documentation

## 2010-12-22 DIAGNOSIS — J45909 Unspecified asthma, uncomplicated: Secondary | ICD-10-CM | POA: Insufficient documentation

## 2010-12-22 DIAGNOSIS — Z79899 Other long term (current) drug therapy: Secondary | ICD-10-CM | POA: Insufficient documentation

## 2010-12-22 DIAGNOSIS — F411 Generalized anxiety disorder: Secondary | ICD-10-CM | POA: Insufficient documentation

## 2010-12-22 DIAGNOSIS — R51 Headache: Secondary | ICD-10-CM | POA: Insufficient documentation

## 2010-12-22 DIAGNOSIS — I10 Essential (primary) hypertension: Secondary | ICD-10-CM | POA: Insufficient documentation

## 2010-12-22 LAB — POCT I-STAT, CHEM 8
BUN: 10 mg/dL (ref 6–23)
Calcium, Ion: 1.22 mmol/L (ref 1.12–1.32)
Creatinine, Ser: 0.6 mg/dL (ref 0.50–1.10)
Glucose, Bld: 85 mg/dL (ref 70–99)
Hemoglobin: 12.6 g/dL (ref 12.0–15.0)
TCO2: 23 mmol/L (ref 0–100)

## 2010-12-22 LAB — CBC
HCT: 35 % — ABNORMAL LOW (ref 36.0–46.0)
Hemoglobin: 11.6 g/dL — ABNORMAL LOW (ref 12.0–15.0)
MCH: 24.9 pg — ABNORMAL LOW (ref 26.0–34.0)
MCHC: 33.1 g/dL (ref 30.0–36.0)
MCV: 75.1 fL — ABNORMAL LOW (ref 78.0–100.0)

## 2015-09-21 ENCOUNTER — Encounter (HOSPITAL_BASED_OUTPATIENT_CLINIC_OR_DEPARTMENT_OTHER): Payer: Self-pay | Admitting: *Deleted

## 2015-09-21 ENCOUNTER — Encounter (HOSPITAL_BASED_OUTPATIENT_CLINIC_OR_DEPARTMENT_OTHER)
Admission: RE | Admit: 2015-09-21 | Discharge: 2015-09-21 | Disposition: A | Discharge status: Home / Self Care | Payer: BLUE CROSS/BLUE SHIELD | Source: Ambulatory Visit | Attending: Orthopedic Surgery | Admitting: Orthopedic Surgery

## 2015-09-21 ENCOUNTER — Other Ambulatory Visit: Payer: Self-pay

## 2015-09-21 DIAGNOSIS — M65312 Trigger thumb, left thumb: Secondary | ICD-10-CM | POA: Diagnosis not present

## 2015-09-21 DIAGNOSIS — Z01810 Encounter for preprocedural cardiovascular examination: Secondary | ICD-10-CM | POA: Diagnosis present

## 2015-09-21 DIAGNOSIS — K219 Gastro-esophageal reflux disease without esophagitis: Secondary | ICD-10-CM | POA: Diagnosis not present

## 2015-09-21 DIAGNOSIS — I10 Essential (primary) hypertension: Secondary | ICD-10-CM | POA: Diagnosis not present

## 2015-09-21 DIAGNOSIS — M67432 Ganglion, left wrist: Secondary | ICD-10-CM | POA: Diagnosis not present

## 2015-09-21 DIAGNOSIS — Z87891 Personal history of nicotine dependence: Secondary | ICD-10-CM | POA: Diagnosis not present

## 2015-09-21 DIAGNOSIS — E039 Hypothyroidism, unspecified: Secondary | ICD-10-CM | POA: Diagnosis not present

## 2015-09-21 NOTE — Progress Notes (Signed)
Pt in for PAT appt. EKG done. Reviewed by Anesthesia.  Per Dr. Al Corpus,  Pt needs to see cardiology before surgery 10/13/15 with Dr. Percell Miller. PT has seen dr. Einar Gip in the past and states she will call today for appt. Copy of today's EKG given to the pt.

## 2015-10-06 NOTE — H&P (Signed)
This is a pleasant 55 year-old female, new patient, who presents to our clinic today with bilateral thumb pain, left greater than right.  She states this has been ongoing for the past 3-4 months and has progressively worsened.  Both thumbs seem to lock on her multiple times throughout the day.  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  She has tried a wrist splint, as well as Cortisone injections to both trigger thumbs by her primary care physician.  She only had about a week relief from the injection.   Past medical history: Significant for hypothyroidism, acid reflux, hypertension, glasses, hearing loss and cough. Allergies: No known drug allergies. Current medications: Levothyroxine, Allegra, Omeprazole, Amlodipine and Metoprolol.   Family history: Significant for diabetes and hypertension.  Negative for heart disease and arthritis.  Social history: Does not smoke or drink.       EXAMINATION: Well-developed, well-nourished female in no acute distress.  Alert and oriented x 3.  Lungs clear to auscultation bilaterally.  Heart sounds normal.  Examination of both thumbs reveal a palpable nodule, left greater than right.  She does have triggering to both thumbs.    IMPRESSION: Triggering of bilateral thumbs.   PLAN: At this point there is nothing short of an A1 pulley release to both thumbs that will cure Destiny Goodwin's issue.  We are going to proceed with the left first.  Risks, benefits and possible complications of surgery have been reviewed.  Rehab and recovery time discussed. All questions answered. Paperwork complete.

## 2015-10-13 ENCOUNTER — Ambulatory Visit (HOSPITAL_BASED_OUTPATIENT_CLINIC_OR_DEPARTMENT_OTHER): Payer: BLUE CROSS/BLUE SHIELD | Admitting: Anesthesiology

## 2015-10-13 ENCOUNTER — Ambulatory Visit (HOSPITAL_BASED_OUTPATIENT_CLINIC_OR_DEPARTMENT_OTHER)
Admission: RE | Admit: 2015-10-13 | Discharge: 2015-10-13 | Disposition: A | Discharge status: Home / Self Care | Payer: BLUE CROSS/BLUE SHIELD | Source: Ambulatory Visit | Attending: Orthopedic Surgery | Admitting: Orthopedic Surgery

## 2015-10-13 ENCOUNTER — Encounter (HOSPITAL_BASED_OUTPATIENT_CLINIC_OR_DEPARTMENT_OTHER): Payer: Self-pay | Admitting: Anesthesiology

## 2015-10-13 ENCOUNTER — Encounter (HOSPITAL_BASED_OUTPATIENT_CLINIC_OR_DEPARTMENT_OTHER)
Admission: RE | Disposition: A | Discharge status: Home / Self Care | Payer: Self-pay | Source: Ambulatory Visit | Attending: Orthopedic Surgery

## 2015-10-13 DIAGNOSIS — M65312 Trigger thumb, left thumb: Secondary | ICD-10-CM | POA: Insufficient documentation

## 2015-10-13 DIAGNOSIS — Z87891 Personal history of nicotine dependence: Secondary | ICD-10-CM | POA: Insufficient documentation

## 2015-10-13 DIAGNOSIS — M67432 Ganglion, left wrist: Secondary | ICD-10-CM | POA: Insufficient documentation

## 2015-10-13 DIAGNOSIS — K219 Gastro-esophageal reflux disease without esophagitis: Secondary | ICD-10-CM | POA: Insufficient documentation

## 2015-10-13 DIAGNOSIS — I10 Essential (primary) hypertension: Secondary | ICD-10-CM | POA: Insufficient documentation

## 2015-10-13 DIAGNOSIS — E039 Hypothyroidism, unspecified: Secondary | ICD-10-CM | POA: Insufficient documentation

## 2015-10-13 HISTORY — PX: TRIGGER FINGER RELEASE: SHX641

## 2015-10-13 HISTORY — DX: Essential (primary) hypertension: I10

## 2015-10-13 HISTORY — PX: CYST REMOVAL HAND: SHX6279

## 2015-10-13 SURGERY — RELEASE, A1 PULLEY, FOR TRIGGER FINGER
Anesthesia: Monitor Anesthesia Care | Site: Thumb | Laterality: Left

## 2015-10-13 MED ORDER — ONDANSETRON HCL 4 MG/2ML IJ SOLN
INTRAMUSCULAR | Status: AC
  Filled 2015-10-13: qty 2

## 2015-10-13 MED ORDER — LIDOCAINE 2% (20 MG/ML) 5 ML SYRINGE
INTRAMUSCULAR | Status: AC
  Filled 2015-10-13: qty 10

## 2015-10-13 MED ORDER — DEXAMETHASONE SODIUM PHOSPHATE 10 MG/ML IJ SOLN
INTRAMUSCULAR | Status: DC | PRN
  Administered 2015-10-13: 10 mg via INTRAVENOUS

## 2015-10-13 MED ORDER — METOCLOPRAMIDE HCL 5 MG PO TABS: 5.0000 mg | ORAL_TABLET | Freq: Three times a day (TID) | ORAL | Status: DC | PRN

## 2015-10-13 MED ORDER — FENTANYL CITRATE (PF) 100 MCG/2ML IJ SOLN
INTRAMUSCULAR | Status: DC | PRN
  Administered 2015-10-13: 50 ug via INTRAVENOUS

## 2015-10-13 MED ORDER — CHLORHEXIDINE GLUCONATE 4 % EX LIQD: 60.0000 mL | Freq: Once | CUTANEOUS | Status: DC

## 2015-10-13 MED ORDER — PROMETHAZINE HCL 25 MG/ML IJ SOLN: 6.2500 mg | INTRAMUSCULAR | Status: DC | PRN

## 2015-10-13 MED ORDER — DEXAMETHASONE SODIUM PHOSPHATE 10 MG/ML IJ SOLN
INTRAMUSCULAR | Status: AC
  Filled 2015-10-13: qty 1

## 2015-10-13 MED ORDER — PROPOFOL 10 MG/ML IV BOLUS
INTRAVENOUS | Status: AC
  Filled 2015-10-13: qty 40

## 2015-10-13 MED ORDER — ONDANSETRON HCL 4 MG PO TABS: 4.0000 mg | ORAL_TABLET | Freq: Three times a day (TID) | ORAL | Status: DC | PRN

## 2015-10-13 MED ORDER — GLYCOPYRROLATE 0.2 MG/ML IJ SOLN: 0.2000 mg | Freq: Once | INTRAMUSCULAR | Status: DC | PRN

## 2015-10-13 MED ORDER — LACTATED RINGERS IV SOLN: INTRAVENOUS | Status: DC

## 2015-10-13 MED ORDER — LIDOCAINE HCL (PF) 0.5 % IJ SOLN
INTRAMUSCULAR | Status: DC | PRN
  Administered 2015-10-13: 175 mg via INTRAVENOUS

## 2015-10-13 MED ORDER — METOCLOPRAMIDE HCL 5 MG/ML IJ SOLN: 5.0000 mg | Freq: Three times a day (TID) | INTRAMUSCULAR | Status: DC | PRN

## 2015-10-13 MED ORDER — SCOPOLAMINE 1 MG/3DAYS TD PT72: 1.0000 | MEDICATED_PATCH | Freq: Once | TRANSDERMAL | Status: DC | PRN

## 2015-10-13 MED ORDER — FENTANYL CITRATE (PF) 100 MCG/2ML IJ SOLN: 25.0000 ug | INTRAMUSCULAR | Status: DC | PRN

## 2015-10-13 MED ORDER — ONDANSETRON HCL 4 MG/2ML IJ SOLN
INTRAMUSCULAR | Status: DC | PRN
  Administered 2015-10-13: 4 mg via INTRAVENOUS

## 2015-10-13 MED ORDER — FENTANYL CITRATE (PF) 100 MCG/2ML IJ SOLN: 50.0000 ug | INTRAMUSCULAR | Status: DC | PRN

## 2015-10-13 MED ORDER — LACTATED RINGERS IV SOLN
INTRAVENOUS | Status: DC
  Administered 2015-10-13: 09:00:00 via INTRAVENOUS

## 2015-10-13 MED ORDER — OXYCODONE-ACETAMINOPHEN 5-325 MG PO TABS: 1.0000 | ORAL_TABLET | ORAL | Status: DC | PRN

## 2015-10-13 MED ORDER — PROPOFOL 500 MG/50ML IV EMUL
INTRAVENOUS | Status: DC | PRN
  Administered 2015-10-13: 20 mL via INTRAVENOUS
  Administered 2015-10-13: 30 mL via INTRAVENOUS

## 2015-10-13 MED ORDER — LACTATED RINGERS IV SOLN
INTRAVENOUS | Status: DC | PRN
  Administered 2015-10-13: 09:00:00 via INTRAVENOUS

## 2015-10-13 MED ORDER — MIDAZOLAM HCL 2 MG/2ML IJ SOLN: 1.0000 mg | INTRAMUSCULAR | Status: DC | PRN

## 2015-10-13 MED ORDER — ONDANSETRON HCL 4 MG PO TABS: 4.0000 mg | ORAL_TABLET | Freq: Four times a day (QID) | ORAL | Status: DC | PRN

## 2015-10-13 MED ORDER — CEFAZOLIN SODIUM-DEXTROSE 2-4 GM/100ML-% IV SOLN
2.0000 g | INTRAVENOUS | Status: AC
  Administered 2015-10-13: 2 g via INTRAVENOUS

## 2015-10-13 MED ORDER — METHOCARBAMOL 500 MG PO TABS: 500.0000 mg | ORAL_TABLET | Freq: Four times a day (QID) | ORAL | Status: DC | PRN

## 2015-10-13 MED ORDER — MIDAZOLAM HCL 2 MG/2ML IJ SOLN
INTRAMUSCULAR | Status: AC
  Filled 2015-10-13: qty 2

## 2015-10-13 MED ORDER — CEFAZOLIN SODIUM-DEXTROSE 2-4 GM/100ML-% IV SOLN
INTRAVENOUS | Status: AC
  Filled 2015-10-13: qty 100

## 2015-10-13 MED ORDER — METHOCARBAMOL 1000 MG/10ML IJ SOLN: 500.0000 mg | Freq: Four times a day (QID) | INTRAMUSCULAR | Status: DC | PRN

## 2015-10-13 MED ORDER — BUPIVACAINE HCL (PF) 0.5 % IJ SOLN
INTRAMUSCULAR | Status: DC | PRN
  Administered 2015-10-13: 2 mL

## 2015-10-13 MED ORDER — OXYCODONE-ACETAMINOPHEN 5-325 MG PO TABS
1.0000 | ORAL_TABLET | ORAL | Status: DC | PRN
Start: 2015-10-13 — End: 2015-10-13

## 2015-10-13 MED ORDER — FENTANYL CITRATE (PF) 100 MCG/2ML IJ SOLN
INTRAMUSCULAR | Status: AC
  Filled 2015-10-13: qty 2

## 2015-10-13 MED ORDER — ONDANSETRON HCL 4 MG/2ML IJ SOLN: 4.0000 mg | Freq: Four times a day (QID) | INTRAMUSCULAR | Status: DC | PRN

## 2015-10-13 MED ORDER — HYDROMORPHONE HCL 1 MG/ML IJ SOLN: 0.5000 mg | INTRAMUSCULAR | Status: DC | PRN

## 2015-10-13 MED ORDER — MIDAZOLAM HCL 5 MG/5ML IJ SOLN
INTRAMUSCULAR | Status: DC | PRN
  Administered 2015-10-13: 2 mg via INTRAVENOUS

## 2015-10-13 SURGICAL SUPPLY — 46 items
BANDAGE ACE 3X5.8 VEL STRL LF (GAUZE/BANDAGES/DRESSINGS) ×3 IMPLANT
BLADE SURG 15 STRL LF DISP TIS (BLADE) ×1 IMPLANT
BLADE SURG 15 STRL SS (BLADE) ×3
BNDG CMPR 9X4 STRL LF SNTH (GAUZE/BANDAGES/DRESSINGS) ×1
BNDG COHESIVE 3X5 TAN STRL LF (GAUZE/BANDAGES/DRESSINGS) ×3 IMPLANT
BNDG ESMARK 4X9 LF (GAUZE/BANDAGES/DRESSINGS) ×2 IMPLANT
CORDS BIPOLAR (ELECTRODE) IMPLANT
COVER BACK TABLE 60X90IN (DRAPES) ×3 IMPLANT
COVER MAYO STAND STRL (DRAPES) ×3 IMPLANT
CUFF TOURNIQUET SINGLE 18IN (TOURNIQUET CUFF) ×2 IMPLANT
DRAPE EXTREMITY T 121X128X90 (DRAPE) ×3 IMPLANT
DRAPE SURG 17X23 STRL (DRAPES) ×3 IMPLANT
DRSG PAD ABDOMINAL 8X10 ST (GAUZE/BANDAGES/DRESSINGS) ×3 IMPLANT
DURAPREP 26ML APPLICATOR (WOUND CARE) ×3 IMPLANT
GAUZE SPONGE 4X4 12PLY STRL (GAUZE/BANDAGES/DRESSINGS) ×3 IMPLANT
GAUZE XEROFORM 1X8 LF (GAUZE/BANDAGES/DRESSINGS) ×3 IMPLANT
GLOVE BIOGEL M STRL SZ7.5 (GLOVE) ×2 IMPLANT
GLOVE BIOGEL PI IND STRL 7.0 (GLOVE) ×1 IMPLANT
GLOVE BIOGEL PI IND STRL 8 (GLOVE) IMPLANT
GLOVE BIOGEL PI INDICATOR 7.0 (GLOVE) ×2
GLOVE BIOGEL PI INDICATOR 8 (GLOVE) ×2
GLOVE ECLIPSE 7.0 STRL STRAW (GLOVE) ×3 IMPLANT
GLOVE SURG ORTHO 8.0 STRL STRW (GLOVE) ×3 IMPLANT
GOWN STRL REUS W/ TWL LRG LVL3 (GOWN DISPOSABLE) ×1 IMPLANT
GOWN STRL REUS W/ TWL XL LVL3 (GOWN DISPOSABLE) ×2 IMPLANT
GOWN STRL REUS W/TWL LRG LVL3 (GOWN DISPOSABLE) ×3
GOWN STRL REUS W/TWL XL LVL3 (GOWN DISPOSABLE) ×8 IMPLANT
NDL HYPO 25X1 1.5 SAFETY (NEEDLE) ×1 IMPLANT
NEEDLE HYPO 25X1 1.5 SAFETY (NEEDLE) ×3 IMPLANT
NS IRRIG 1000ML POUR BTL (IV SOLUTION) ×3 IMPLANT
PACK BASIN DAY SURGERY FS (CUSTOM PROCEDURE TRAY) ×3 IMPLANT
PAD CAST 3X4 CTTN HI CHSV (CAST SUPPLIES) ×2 IMPLANT
PADDING CAST ABS 3INX4YD NS (CAST SUPPLIES)
PADDING CAST ABS 4INX4YD NS (CAST SUPPLIES)
PADDING CAST ABS COTTON 3X4 (CAST SUPPLIES) ×1 IMPLANT
PADDING CAST ABS COTTON 4X4 ST (CAST SUPPLIES) ×1 IMPLANT
PADDING CAST COTTON 3X4 STRL (CAST SUPPLIES) ×6
SPLINT FIBERGLASS 3X35 (CAST SUPPLIES) ×1 IMPLANT
SPLINT PLASTER CAST XFAST 3X15 (CAST SUPPLIES) IMPLANT
SPLINT PLASTER XTRA FASTSET 3X (CAST SUPPLIES)
STOCKINETTE 4X48 STRL (DRAPES) ×3 IMPLANT
SUT ETHILON 3 0 PS 1 (SUTURE) ×3 IMPLANT
SYR BULB 3OZ (MISCELLANEOUS) ×3 IMPLANT
SYR CONTROL 10ML LL (SYRINGE) ×3 IMPLANT
TOWEL OR 17X24 6PK STRL BLUE (TOWEL DISPOSABLE) ×3 IMPLANT
UNDERPAD 30X30 (UNDERPADS AND DIAPERS) ×3 IMPLANT

## 2015-10-13 NOTE — Anesthesia Postprocedure Evaluation (Signed)
Anesthesia Post Note  Patient: Destiny Goodwin  Procedure(s) Performed: Procedure(s) (LRB): LEFT THUMB TRIGGER FINGER RELEASE  TENDON SHEATH INCISION  (Left) EXCISION GANGLION LEFT THUMB (Left)  Patient location during evaluation: PACU Anesthesia Type: MAC and Bier Block Level of consciousness: awake and alert Pain management: pain level controlled Vital Signs Assessment: post-procedure vital signs reviewed and stable Respiratory status: spontaneous breathing, nonlabored ventilation, respiratory function stable and patient connected to nasal cannula oxygen Cardiovascular status: stable and blood pressure returned to baseline Anesthetic complications: no    Last Vitals:  Filed Vitals:   10/13/15 1013 10/13/15 1015  BP:  94/69  Pulse: 63 63  Temp:    Resp:  15    Last Pain: There were no vitals filed for this visit.               Zenaida Deed

## 2015-10-13 NOTE — Interval H&P Note (Signed)
History and Physical Interval Note:  10/13/2015 7:25 AM  Destiny Goodwin  has presented today for surgery, with the diagnosis of TRIGGER THUMB LEFT THUMB   The various methods of treatment have been discussed with the patient and family. After consideration of risks, benefits and other options for treatment, the patient has consented to  Procedure(s): [place]  (Left) as a surgical intervention .  The patient's history has been reviewed, patient examined, no change in status, stable for surgery.  I have reviewed the patient's chart and labs.  Questions were answered to the patient's satisfaction.     [name]

## 2015-10-13 NOTE — Discharge Instructions (Signed)
Do not remove bandages.  Do not get bandages wet.  May apply ice for up to 20 min at a time for pain and swelling.  Follow up appointment in one week.  SEEK MEDICAL CARE IF: You have swelling of your calf or leg.  You develop shortness of breath or chest pain.  You have redness, swelling, or increasing pain in the wound.  There is pus or any unusual drainage coming from the surgical site.  You notice a bad smell coming from the surgical site or dressing.  The surgical site breaks open after sutures or staples have been removed.  There is persistent bleeding from the suture or staple line.  You are getting worse or are not improving.  You have any other questions or concerns.  SEEK IMMEDIATE MEDICAL CARE IF:  You have a fever.  You develop a rash.  You have difficulty breathing.  You develop any reaction or side effects to medicines given.  Your knee motion is decreasing rather than improving.  MAKE SURE YOU:  Understand these instructions.  Will watch your condition.  Will get help right away if you are not doing well or get worse.    Post Anesthesia Home Care Instructions  Activity: Get plenty of rest for the remainder of the day. A responsible adult should stay with you for 24 hours following the procedure.  For the next 24 hours, DO NOT: -Drive a car -Paediatric nurse -Drink alcoholic beverages -Take any medication unless instructed by your physician -Make any legal decisions or sign important papers.  Meals: Start with liquid foods such as gelatin or soup. Progress to regular foods as tolerated. Avoid greasy, spicy, heavy foods. If nausea and/or vomiting occur, drink only clear liquids until the nausea and/or vomiting subsides. Call your physician if vomiting continues.  Special Instructions/Symptoms: Your throat may feel dry or sore from the anesthesia or the breathing tube placed in your throat during surgery. If this causes discomfort, gargle with warm salt water. The  discomfort should disappear within 24 hours.  If you had a scopolamine patch placed behind your ear for the management of post- operative nausea and/or vomiting:  1. The medication in the patch is effective for 72 hours, after which it should be removed.  Wrap patch in a tissue and discard in the trash. Wash hands thoroughly with soap and water. 2. You may remove the patch earlier than 72 hours if you experience unpleasant side effects which may include dry mouth, dizziness or visual disturbances. 3. Avoid touching the patch. Wash your hands with soap and water after contact with the patch.

## 2015-10-13 NOTE — Anesthesia Procedure Notes (Signed)
Procedure Name: MAC Date/Time: 10/13/2015 9:35 AM Performed by: Wanita Chamberlain Pre-anesthesia Checklist: Patient identified, Timeout performed, Emergency Drugs available, Suction available and Patient being monitored Patient Re-evaluated:Patient Re-evaluated prior to inductionOxygen Delivery Method: Simple face mask Preoxygenation: Pre-oxygenation with 100% oxygen Intubation Type: IV induction Placement Confirmation: positive ETCO2 Dental Injury: Teeth and Oropharynx as per pre-operative assessment

## 2015-10-13 NOTE — Transfer of Care (Signed)
Immediate Anesthesia Transfer of Care Note  Patient: Destiny Goodwin  Procedure(s) Performed: Procedure(s): LEFT THUMB TRIGGER FINGER RELEASE  TENDON SHEATH INCISION  (Left) EXCISION GANGLION LEFT THUMB (Left)  Patient Location: PACU  Anesthesia Type:MAC and Bier block  Level of Consciousness: awake, alert , oriented and patient cooperative  Airway & Oxygen Therapy: Patient Spontanous Breathing  Post-op Assessment: Report given to RN and Post -op Vital signs reviewed and stable  Post vital signs: Reviewed and stable  Last Vitals:  Filed Vitals:   10/13/15 0922  BP: 136/57  Pulse: 61  Temp: 36.7 C  Resp: 16    Last Pain: There were no vitals filed for this visit.       Complications: No apparent anesthesia complications

## 2015-10-13 NOTE — Op Note (Signed)
Destiny Goodwin, Destiny Goodwin NO.:  [id number]  MEDICAL RECORD NO.:  [id number]  LOCATION:                                 FACILITY:  PHYSICIAN:  [name], M.D. DATE OF BIRTH:  Sep 19, 1960  DATE OF PROCEDURE:  10/13/2015 DATE OF DISCHARGE:                              OPERATIVE REPORT   PREOPERATIVE DIAGNOSIS:  Symptomatic triggering A1 pulley, left thumb.  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  PROCEDURE:  Left thumb release A1 pulley.  Excision of volar retinacular ganglion.  SURGEON:  [name], M.D.  ASSIST:  Elmyra Ricks, PA.  ANESTHESIA:  IV regional.  SPECIMENS:  None.  CULTURES:  None.  COMPLICATIONS:  None.  DRESSINGS:  Soft compressive thumb spica splint bulky hand dressing.  DESCRIPTION OF PROCEDURE:  The patient was brought to the operating room.  After adequate anesthesia had been obtained, prepped and draped in usual sterile fashion.  A small transverse incision was made over the A1 pulley, left thumb.  Neurovascular bundles were identified, protected and retracted.  The A1 pulley exposed.  There was about a 3-mm tense ganglion on the ulnar side of the A1 pulley.  This was excised in its entirety.  I then completed the release of the entire A1 pulley obliterating triggering. Wound irrigated and closed with nylon.  Margins were injected with Marcaine.  Sterile compressive dressing applied.  Bulky hand dressing was placed.  Anesthesia reversed.  Brought to the recovery room. Tolerated the surgery well.  No complications.     [name], M.D.   ______________________________ [name], M.D.    DFM/MEDQ  D:  10/13/2015  T:  10/13/2015  Job:  [id number]

## 2015-10-13 NOTE — Anesthesia Preprocedure Evaluation (Signed)
Anesthesia Evaluation  Patient identified by MRN, date of birth, ID band Patient awake    Reviewed: Allergy & Precautions, H&P , NPO status , Patient's Chart, lab work & pertinent test results, reviewed documented beta blocker date and time   Airway Mallampati: II  TM Distance: <3 FB Neck ROM: Full    Dental  (+) Teeth Intact   Pulmonary asthma (no inhaler use since March) , former smoker,    breath sounds clear to auscultation       Cardiovascular hypertension, Pt. on medications  Rhythm:Regular Rate:Normal     Neuro/Psych PSYCHIATRIC DISORDERS Anxiety Depression    GI/Hepatic GERD  ,  Endo/Other  Hypothyroidism (on synthroid)   Renal/GU      Musculoskeletal   Abdominal   Peds  Hematology   Anesthesia Other Findings   Reproductive/Obstetrics                             Anesthesia Physical  Anesthesia Plan  ASA: II  Anesthesia Plan: General   Post-op Pain Management:    Induction: Intravenous  Airway Management Planned: LMA  Additional Equipment:   Intra-op Plan:   Post-operative Plan: Extubation in OR  Informed Consent: I have reviewed the patients History and Physical, chart, labs and discussed the procedure including the risks, benefits and alternatives for the proposed anesthesia with the patient or authorized representative who has indicated his/her understanding and acceptance.   Dental advisory given  Plan Discussed with: Surgeon, CRNA and Anesthesiologist  Anesthesia Plan Comments:         Anesthesia Quick Evaluation

## 2015-10-14 NOTE — Addendum Note (Signed)
Addendum  created 10/14/15 1408 by Ernesta Amble Braeton Wolgamott, CRNA   Modules edited: Charges VN

## 2015-10-16 ENCOUNTER — Encounter (HOSPITAL_BASED_OUTPATIENT_CLINIC_OR_DEPARTMENT_OTHER): Payer: Self-pay | Admitting: Orthopedic Surgery

## 2016-07-10 ENCOUNTER — Other Ambulatory Visit: Payer: Self-pay | Admitting: Internal Medicine

## 2016-07-10 DIAGNOSIS — Z1231 Encounter for screening mammogram for malignant neoplasm of breast: Secondary | ICD-10-CM

## 2016-07-20 ENCOUNTER — Other Ambulatory Visit: Payer: Self-pay | Admitting: Obstetrics and Gynecology

## 2016-07-20 DIAGNOSIS — R5381 Other malaise: Secondary | ICD-10-CM

## 2016-07-23 ENCOUNTER — Other Ambulatory Visit: Payer: Self-pay | Admitting: Obstetrics and Gynecology

## 2016-07-23 DIAGNOSIS — N951 Menopausal and female climacteric states: Secondary | ICD-10-CM

## 2016-07-26 ENCOUNTER — Ambulatory Visit
Admission: RE | Admit: 2016-07-26 | Discharge: 2016-07-26 | Disposition: A | Discharge status: Home / Self Care | Payer: BLUE CROSS/BLUE SHIELD | Source: Ambulatory Visit | Attending: Internal Medicine | Admitting: Internal Medicine

## 2016-07-26 ENCOUNTER — Ambulatory Visit
Admission: RE | Admit: 2016-07-26 | Discharge: 2016-07-26 | Disposition: A | Discharge status: Home / Self Care | Payer: BLUE CROSS/BLUE SHIELD | Source: Ambulatory Visit | Attending: Obstetrics and Gynecology | Admitting: Obstetrics and Gynecology

## 2016-07-26 DIAGNOSIS — N951 Menopausal and female climacteric states: Secondary | ICD-10-CM

## 2016-07-26 DIAGNOSIS — Z1231 Encounter for screening mammogram for malignant neoplasm of breast: Secondary | ICD-10-CM

## 2016-07-27 ENCOUNTER — Ambulatory Visit: Payer: Commercial Managed Care - PPO

## 2018-03-17 ENCOUNTER — Encounter (HOSPITAL_COMMUNITY): Payer: Self-pay | Admitting: *Deleted

## 2018-03-17 ENCOUNTER — Ambulatory Visit (HOSPITAL_COMMUNITY)
Admission: EM | Admit: 2018-03-17 | Discharge: 2018-03-17 | Disposition: A | Discharge status: Home / Self Care | Payer: BLUE CROSS/BLUE SHIELD | Attending: Family Medicine | Admitting: Family Medicine

## 2018-03-17 ENCOUNTER — Other Ambulatory Visit: Payer: Self-pay

## 2018-03-17 DIAGNOSIS — H8110 Benign paroxysmal vertigo, unspecified ear: Secondary | ICD-10-CM | POA: Diagnosis present

## 2018-03-17 LAB — GLUCOSE, CAPILLARY: Glucose-Capillary: 97 mg/dL (ref 70–99)

## 2018-03-17 MED ORDER — MECLIZINE HCL 12.5 MG PO TABS: 12.5000 mg | ORAL_TABLET | Freq: Three times a day (TID) | ORAL | 0 refills | Status: DC | PRN

## 2018-03-17 NOTE — ED Provider Notes (Signed)
[place]    CSN: [id number] Arrival date & time: 03/17/18  1815     History   Chief Complaint Chief Complaint  Patient presents with  . Abdominal Pain  . Near Syncope  . Headache    HPI Destiny Goodwin is a 57 y.o. female.   Patient was driving down the road and had a onset of dizziness.  She denies any pain never had any symptoms like this before.  She had to pull off the road to see became less dizzy.  She is followed at [facility] primary care for prediabetes and possible thyroid disorder  HPI  Past Medical History:  Diagnosis Date  . GERD (gastroesophageal reflux disease)    no RX  . Hypertension   . Hypothyroidism   . Palpitations     There are no active problems to display for this patient.   Past Surgical History:  Procedure Laterality Date  . ABDOMINAL HYSTERECTOMY  11/01/2010   Procedure: HYSTERECTOMY ABDOMINAL;  Surgeon: Avel Sensor, MD;  Location: [facility];  Service: Gynecology;  Laterality: N/A;  Cystoscopy  . [place] PROCEDURE  11/01/2010   Procedure: BURCH PROCEDURE;  Surgeon: Avel Sensor, MD;  Location: [facility];  Service: Gynecology;  Laterality: N/A;  . CYST REMOVAL HAND Left 10/13/2015   Procedure: EXCISION GANGLION LEFT THUMB;  Surgeon: [name], MD;  Location: [facility];  Service: Orthopedics;  Laterality: Left;  . DILATION AND CURETTAGE OF UTERUS    . Left index finger surgery    . TRIGGER FINGER RELEASE Left 10/13/2015   Procedure: LEFT THUMB TRIGGER FINGER RELEASE  TENDON SHEATH INCISION ;  Surgeon: [name], MD;  Location: [facility];  Service: Orthopedics;  Laterality: Left;  . TUBAL LIGATION      OB History   No obstetric history on file.      Home Medications    Prior to Admission medications   Medication Sig Start Date End Date Taking? Authorizing Provider  AMLODIPINE BESYLATE PO Take 5 mg by mouth.    [provider]  Cholecalciferol (VITAMIN D3) 3000 units TABS  Take 5,000 Units by mouth.    [provider]  fexofenadine (ALLEGRA) 180 MG tablet Take 180 mg by mouth daily.    [provider]  levothyroxine (SYNTHROID, LEVOTHROID) 50 MCG tablet Take 50 mcg by mouth daily.      [provider]  meclizine (ANTIVERT) 25 MG tablet Take 25 mg by mouth every 8 (eight) hours.      [provider]  metoprolol succinate (TOPROL-XL) 100 MG 24 hr tablet Take 100 mg by mouth daily. Take with or immediately following a meal.    [provider]  Multiple Vitamins-Iron (MULTIVITAMIN/IRON PO) Take 1 tablet by mouth daily.      [provider]  omeprazole (PRILOSEC) 20 MG capsule Take 20 mg by mouth daily.    [provider]  ondansetron (ZOFRAN) 4 MG tablet Take 1 tablet (4 mg total) by mouth every 8 (eight) hours as needed for nausea or vomiting. 10/13/15   Aundra Dubin, PA-C  oxyCODONE-acetaminophen (ROXICET) 5-325 MG tablet Take 1-2 tablets by mouth every 4 (four) hours as needed. 10/13/15   Aundra Dubin, PA-C  valsartan-hydrochlorothiazide (DIOVAN-HCT) 320-25 MG tablet Take 1 tablet by mouth daily.    [provider]    Family History No family history on file.  Social History Social History   Tobacco Use  .  Smoking status: Former Smoker    Types: Cigarettes  . Smokeless tobacco: Never Used  . Tobacco comment: Quit 20 years ago  Substance Use Topics  . Alcohol use: Not Currently    Comment: Drink socially  . Drug use: No     Allergies   Fish oil   Review of Systems Review of Systems  Constitutional: Negative.   HENT: Negative.   Respiratory: Negative.   Cardiovascular: Negative.   Gastrointestinal: Negative.   Neurological: Positive for dizziness.  Psychiatric/Behavioral: Negative.      Physical Exam Triage Vital Signs ED Triage Vitals  Enc Vitals Group     BP 03/17/18 1920 133/66     Pulse Rate 03/17/18 1920 64     Resp 03/17/18 1920 16     Temp 03/17/18  1920 97.9 F (36.6 C)     Temp Source 03/17/18 1920 Oral     SpO2 03/17/18 1920 100 %     Weight --      Height --      Head Circumference --      Peak Flow --      Pain Score 03/17/18 1922 8     Pain Loc --      Pain Edu? --      Excl. in [place]? --    No data found.  Updated Vital Signs BP 133/66 (BP Location: Right Arm)   Pulse 64   Temp 97.9 F (36.6 C) (Oral)   Resp 16   LMP 09/26/2010   SpO2 100%   Visual Acuity Right Eye Distance:   Left Eye Distance:   Bilateral Distance:    Right Eye Near:   Left Eye Near:    Bilateral Near:     Physical Exam Constitutional:      Appearance: She is well-developed.  HENT:     Head: Normocephalic.  Eyes:     Extraocular Movements: Extraocular movements intact.     Comments: There is mild nystagmus on lateral gaze  Cardiovascular:     Rate and Rhythm: Normal rate and regular rhythm.  Pulmonary:     Breath sounds: Normal breath sounds.  Abdominal:     Palpations: Abdomen is soft.  Neurological:     General: No focal deficit present.     Mental Status: She is alert.     Comments: Normal tandem gait and Romberg Normal finger-to-nose  Psychiatric:        Behavior: Behavior normal.      UC Treatments / Results  Labs (all labs ordered are listed, but only abnormal results are displayed) Labs Reviewed  CBG MONITORING, ED    EKG None  Radiology No results found.  Procedures Procedures (including critical care time)  Medications Ordered in UC Medications - No data to display  Initial Impression / Assessment and Plan / UC Course  I have reviewed the triage vital signs and the nursing notes.  Pertinent labs & imaging results that were available during my care of the patient were reviewed by me and considered in my medical decision making (see chart for details).     Vertigo, probably related to inner ear infection.  Will give meclizine and instructions with Epley maneuver.  Follow-up with primary care  physician Final Clinical Impressions(s) / UC Diagnoses   Final diagnoses:  None   Discharge Instructions   None    ED Prescriptions    None     Controlled Substance Prescriptions [place] Controlled Substance Registry consulted? No  Wardell Honour, MD 03/17/18 2011

## 2018-03-17 NOTE — ED Triage Notes (Signed)
States she was driving today and states she felt dizzy and felt like she was going to pass out. States she hasn't felt well for several days

## 2018-10-21 ENCOUNTER — Other Ambulatory Visit: Payer: Self-pay | Admitting: Physician Assistant

## 2018-10-21 DIAGNOSIS — Z1231 Encounter for screening mammogram for malignant neoplasm of breast: Secondary | ICD-10-CM

## 2018-11-14 ENCOUNTER — Other Ambulatory Visit: Payer: Self-pay | Admitting: Obstetrics & Gynecology

## 2018-11-14 DIAGNOSIS — N644 Mastodynia: Secondary | ICD-10-CM

## 2018-11-21 ENCOUNTER — Other Ambulatory Visit: Payer: BLUE CROSS/BLUE SHIELD

## 2018-12-02 ENCOUNTER — Other Ambulatory Visit: Payer: Self-pay

## 2018-12-11 ENCOUNTER — Other Ambulatory Visit: Payer: Self-pay

## 2018-12-11 ENCOUNTER — Ambulatory Visit
Admission: RE | Admit: 2018-12-11 | Discharge: 2018-12-11 | Disposition: A | Discharge status: Home / Self Care | Payer: BC Managed Care – PPO | Source: Ambulatory Visit | Attending: Obstetrics & Gynecology | Admitting: Obstetrics & Gynecology

## 2018-12-11 DIAGNOSIS — N644 Mastodynia: Secondary | ICD-10-CM

## 2018-12-29 ENCOUNTER — Ambulatory Visit: Payer: BLUE CROSS/BLUE SHIELD

## 2019-05-25 ENCOUNTER — Emergency Department (HOSPITAL_BASED_OUTPATIENT_CLINIC_OR_DEPARTMENT_OTHER)
Admission: EM | Admit: 2019-05-25 | Discharge: 2019-05-25 | Disposition: A | Discharge status: Home / Self Care | Payer: No Typology Code available for payment source | Attending: Emergency Medicine | Admitting: Emergency Medicine

## 2019-05-25 ENCOUNTER — Encounter (HOSPITAL_BASED_OUTPATIENT_CLINIC_OR_DEPARTMENT_OTHER): Payer: Self-pay

## 2019-05-25 ENCOUNTER — Other Ambulatory Visit: Payer: Self-pay

## 2019-05-25 DIAGNOSIS — Y990 Civilian activity done for income or pay: Secondary | ICD-10-CM | POA: Diagnosis not present

## 2019-05-25 DIAGNOSIS — Z87891 Personal history of nicotine dependence: Secondary | ICD-10-CM | POA: Diagnosis not present

## 2019-05-25 DIAGNOSIS — I10 Essential (primary) hypertension: Secondary | ICD-10-CM | POA: Insufficient documentation

## 2019-05-25 DIAGNOSIS — S61212A Laceration without foreign body of right middle finger without damage to nail, initial encounter: Secondary | ICD-10-CM | POA: Diagnosis present

## 2019-05-25 DIAGNOSIS — Y929 Unspecified place or not applicable: Secondary | ICD-10-CM | POA: Insufficient documentation

## 2019-05-25 DIAGNOSIS — Y939 Activity, unspecified: Secondary | ICD-10-CM | POA: Diagnosis not present

## 2019-05-25 DIAGNOSIS — Z79899 Other long term (current) drug therapy: Secondary | ICD-10-CM | POA: Diagnosis not present

## 2019-05-25 DIAGNOSIS — E039 Hypothyroidism, unspecified: Secondary | ICD-10-CM | POA: Insufficient documentation

## 2019-05-25 DIAGNOSIS — W269XXA Contact with unspecified sharp object(s), initial encounter: Secondary | ICD-10-CM | POA: Diagnosis not present

## 2019-05-25 NOTE — ED Provider Notes (Addendum)
Destiny Goodwin   CSN: [id number] Arrival date & time: 05/25/19  [id number]     History Chief Complaint  Patient presents with  . Finger Injury    Destiny Goodwin is a 59 y.o. female with pertinent PMH of GERD, HTN, Hypothyroidism, trigger finger s/p surgical release, who presents with a chief complaint of finger laceration.  HPI  Patient states that she hit her hand this morning and now has a laceration of her right middle finger at PIP joint. Patient admits to full ROM, strength, and sensation, but does have pain with movement of this joint.      Past Medical History:  Diagnosis Date  . GERD (gastroesophageal reflux disease)    no RX  . Hypertension   . Hypothyroidism   . Palpitations     Patient Active Problem List   Diagnosis Date Noted  . Benign paroxysmal positional vertigo 03/17/2018    Past Surgical History:  Procedure Laterality Date  . ABDOMINAL HYSTERECTOMY  11/01/2010   Procedure: HYSTERECTOMY ABDOMINAL;  Surgeon: Avel Sensor, MD;  Location: [facility];  Service: Gynecology;  Laterality: N/A;  Cystoscopy  . [place] PROCEDURE  11/01/2010   Procedure: BURCH PROCEDURE;  Surgeon: Avel Sensor, MD;  Location: [facility];  Service: Gynecology;  Laterality: N/A;  . CYST REMOVAL HAND Left 10/13/2015   Procedure: EXCISION GANGLION LEFT THUMB;  Surgeon: [name], MD;  Location: [facility];  Service: Orthopedics;  Laterality: Left;  . DILATION AND CURETTAGE OF UTERUS    . Left index finger surgery    . TRIGGER FINGER RELEASE Left 10/13/2015   Procedure: LEFT THUMB TRIGGER FINGER RELEASE  TENDON SHEATH INCISION ;  Surgeon: [name], MD;  Location: [facility];  Service: Orthopedics;  Laterality: Left;  . TUBAL LIGATION       OB History   No obstetric history on file.     No family history on file.  Social History   Tobacco Use  . Smoking status: Former Smoker    Types: Cigarettes  .  Smokeless tobacco: Never Used  . Tobacco comment: Quit 20 years ago  Substance Use Topics  . Alcohol use: Not Currently    Comment: Drink socially  . Drug use: No    Home Medications Prior to Admission medications   Medication Sig Start Date End Date Taking? Authorizing Provider  AMLODIPINE BESYLATE PO Take 5 mg by mouth.    [provider]  Cholecalciferol (VITAMIN D3) 3000 units TABS Take 5,000 Units by mouth.    [provider]  fexofenadine (ALLEGRA) 180 MG tablet Take 180 mg by mouth daily.    [provider]  levothyroxine (SYNTHROID, LEVOTHROID) 50 MCG tablet Take 50 mcg by mouth daily.      [provider]  meclizine (ANTIVERT) 12.5 MG tablet Take 1 tablet (12.5 mg total) by mouth 3 (three) times daily as needed for dizziness. 03/17/18   Wardell Honour, MD  meclizine (ANTIVERT) 25 MG tablet Take 25 mg by mouth every 8 (eight) hours.      [provider]  metoprolol succinate (TOPROL-XL) 100 MG 24 hr tablet Take 100 mg by mouth daily. Take with or immediately following a meal.    [provider]  Multiple Vitamins-Iron (MULTIVITAMIN/IRON PO) Take 1 tablet by mouth daily.      [provider]  omeprazole (PRILOSEC) 20 MG capsule Take 20 mg by mouth daily.  [provider]  ondansetron (ZOFRAN) 4 MG tablet Take 1 tablet (4 mg total) by mouth every 8 (eight) hours as needed for nausea or vomiting. 10/13/15   Aundra Dubin, PA-C  oxyCODONE-acetaminophen (ROXICET) 5-325 MG tablet Take 1-2 tablets by mouth every 4 (four) hours as needed. 10/13/15   Aundra Dubin, PA-C  valsartan-hydrochlorothiazide (DIOVAN-HCT) 320-25 MG tablet Take 1 tablet by mouth daily.    [provider]    Allergies    Fish oil  Review of Systems   Review of Systems - All review of systems are negative except what is noted on the HPI.   Physical Exam Updated Vital Signs BP 129/75 (BP Location: Left Arm)   Pulse 96    Temp 98.4 F (36.9 C) (Oral)   Resp 16   Ht 4\' 11"  (1.499 m)   Wt 95.3 kg   LMP 09/26/2010   SpO2 96%   BMI 42.41 kg/m   Physical Exam Constitutional:      Appearance: Normal appearance.  HENT:     Head: Normocephalic and atraumatic.  Eyes:     Extraocular Movements: Extraocular movements intact.  Cardiovascular:     Rate and Rhythm: Normal rate.     Pulses: Normal pulses.  Pulmonary:     Effort: Pulmonary effort is normal. No respiratory distress.  Musculoskeletal:        General: Signs of injury (right 3rd digit at the PIP joint) present. No swelling, tenderness or deformity. Normal range of motion.  Skin:    General: Skin is warm and dry.     Findings: Laceration (at the right 3rd digit at the PIP joint, approximately 2 cm in length, clean margines without debris ) present.  Neurological:     General: No focal deficit present.     Mental Status: She is alert and oriented to person, place, and time.     Sensory: No sensory deficit.     Motor: No weakness.     ED Results / Procedures / Treatments   Labs (all labs ordered are listed, but only abnormal results are displayed) Labs Reviewed - No data to display  EKG None  Radiology No results found.  Procedures Procedures (including critical care time)  Medications Ordered in ED Medications - No data to display  ED Course  I have reviewed the triage vital signs and the nursing notes.  Pertinent labs & imaging results that were available during my care of the patient were reviewed by me and considered in my medical decision making (see chart for details).    MDM Rules/Calculators/A&P                      Right 3rd digit laceration at the PIP joint The patient presented with a finger laceration of approximately 2 cm in length. The wound had clean margins that approximated well. The wound will heal well with [place]. I covered the laceration with bacitracin ointment and splinted the finger to promote  healing. Patient was instructed to keep the splint on for a week and follow up with her PCP for re-evaluation as needed.   Final Clinical Impression(s) / ED Diagnoses Final diagnoses:  Laceration of right middle finger without foreign body without damage to nail, initial encounter    Rx / DC Orders ED Discharge Orders    None       Marianna Payment, MD 05/25/19 [id number]    Marianna Payment, MD 05/25/19 [phone]  Little, Wenda Overland, MD 05/25/19 [phone]

## 2019-05-25 NOTE — ED Notes (Signed)
ED Provider at bedside. 

## 2019-05-25 NOTE — ED Triage Notes (Signed)
Pt arrived ambulatory to ED with c/o laceration to middle right finger that she got a work today, thinking that she cut it on metal.

## 2019-05-25 NOTE — Discharge Instructions (Addendum)
Thank you for let us participate in your care. Today we discussed your hand wound.See instructions bellow:  Instructions: Keep bandage and splint on hand for 1 week Can hand wraps for at least another week after that . You can you topical ointment to cover the cut area then cover it with a bandage after the first week Please only perform light duty at work for a week.   If your pain worsens or there is any signs of infection please return to the ED. Otherwise, follow up with your primary care physician.

## 2019-05-25 NOTE — ED Provider Notes (Signed)
.  Splint Application  Date/Time: 05/25/2019 9:17 AM Performed by: Marianna Payment, MD Authorized by: Sharlett Iles, MD   Consent:    Consent obtained:  Verbal   Consent given by:  Patient Pre-procedure details:    Sensation:  Normal Procedure details:    Laterality:  Right   Location:  Finger   Finger:  R long finger   Strapping: no     Cast type:  Finger   Splint type:  Finger   Supplies:  Aluminum splint Post-procedure details:    Pain:  Unchanged   Sensation:  Normal   Patient tolerance of procedure:  Tolerated well, no immediate complications      Matias Thurman, Wenda Overland, MD 05/25/19 [phone]

## 2019-06-04 ENCOUNTER — Other Ambulatory Visit (HOSPITAL_COMMUNITY): Payer: Self-pay | Admitting: Gastroenterology

## 2019-06-04 ENCOUNTER — Other Ambulatory Visit: Payer: Self-pay | Admitting: Gastroenterology

## 2019-06-04 DIAGNOSIS — R1011 Right upper quadrant pain: Secondary | ICD-10-CM

## 2019-08-14 ENCOUNTER — Other Ambulatory Visit: Payer: Self-pay

## 2019-08-14 ENCOUNTER — Encounter (HOSPITAL_COMMUNITY)
Admission: RE | Admit: 2019-08-14 | Discharge: 2019-08-14 | Disposition: A | Discharge status: Home / Self Care | Payer: BC Managed Care – PPO | Source: Ambulatory Visit | Attending: Gastroenterology | Admitting: Gastroenterology

## 2019-08-14 ENCOUNTER — Other Ambulatory Visit (HOSPITAL_COMMUNITY): Payer: Self-pay | Admitting: Gastroenterology

## 2019-08-14 DIAGNOSIS — R1011 Right upper quadrant pain: Secondary | ICD-10-CM

## 2019-08-14 MED ORDER — TECHNETIUM TC 99M MEBROFENIN IV KIT
5.0000 | PACK | Freq: Once | INTRAVENOUS | Status: AC | PRN
  Administered 2019-08-14: 5 via INTRAVENOUS

## 2019-08-28 NOTE — Progress Notes (Signed)
Primary Physician/Referring:  Patient, No Pcp Per  Patient ID: Destiny Goodwin, female    DOB: 1960-07-10, 59 y.o.   MRN: [id number]  Chief Complaint  Patient presents with  . Shortness of Breath  . Chest Pain  . New Patient (Initial Visit)   HPI:    Destiny Goodwin  is a 59 y.o. Caucasian female with history of hypertension, hypothyroidism, GERD, prediabetes chronic migraine headaches with aura, obesity, referred to [place] for evaluation of chest pain, patient evaluated by Dr. Collene Mares by GI, being treated for GERD but concerned about chest pain.  I last seen her in July 2017, she has had a negative treadmill stress test at that time.  Over the past few months, she has been having frequent episodes of chest tightness, she was evaluated for GI etiology and found to have GERD but also due to chest tightness that is different from GERD she was recommended further cardiac evaluation.  Chest pain is described as tightness in the middle of the chest without radiation.  Last anywhere from 5 to 10 minutes with spontaneous relief, occasionally associated with exertion and sometimes even at rest.  Except being active at work, she has no exercise program.  She also has chronic dyspnea on exertion and attributes this to weight.  No PND or orthopnea.    Past Medical History:  Diagnosis Date  . GERD (gastroesophageal reflux disease)    no RX  . Hyperlipidemia   . Hypertension   . Hypothyroidism   . Palpitations    Past Surgical History:  Procedure Laterality Date  . ABDOMINAL HYSTERECTOMY  11/01/2010   Procedure: HYSTERECTOMY ABDOMINAL;  Surgeon: Avel Sensor, MD;  Location: [facility];  Service: Gynecology;  Laterality: N/A;  Cystoscopy  . [place] PROCEDURE  11/01/2010   Procedure: BURCH PROCEDURE;  Surgeon: Avel Sensor, MD;  Location: [facility];  Service: Gynecology;  Laterality: N/A;  . CYST REMOVAL HAND Left 10/13/2015   Procedure: EXCISION GANGLION LEFT THUMB;  Surgeon: [name], MD;   Location: [facility];  Service: Orthopedics;  Laterality: Left;  . DILATION AND CURETTAGE OF UTERUS    . Left index finger surgery    . TRIGGER FINGER RELEASE Left 10/13/2015   Procedure: LEFT THUMB TRIGGER FINGER RELEASE  TENDON SHEATH INCISION ;  Surgeon: [name], MD;  Location: [facility];  Service: Orthopedics;  Laterality: Left;  . TUBAL LIGATION     Family History  Problem Relation Age of Onset  . Colon cancer Mother   . Hypertension Mother   . Cancer Father   . Hypertension Sister   . Hypertension Sister     Social History   Tobacco Use  . Smoking status: Former Smoker    Packs/day: 0.25    Years: 4.00    Pack years: 1.00    Types: Cigarettes    Quit date: 2001    Years since quitting: 20.4  . Smokeless tobacco: Never Used  . Tobacco comment: Quit 20 years ago  Substance Use Topics  . Alcohol use: Yes    Comment: Drink socially   Marital Status: Single  ROS  Review of Systems  Cardiovascular: Positive for chest pain and dyspnea on exertion. Negative for leg swelling and syncope.  Musculoskeletal: Positive for arthritis.  Gastrointestinal: Positive for heartburn. Negative for melena.   Objective  Blood pressure 133/72, pulse 61, resp. rate 17, height 4\' 11"  (1.499 m), weight 217 lb (98.4 kg), last menstrual  period 09/26/2010, SpO2 98 %.  Vitals with BMI 09/03/2019 05/25/2019 05/25/2019  Height 4\' 11"  - 4\' 11"   Weight 217 lbs - 210 lbs  BMI [id number] - 0000000  Systolic Q000111Q [id number] Q000111Q  Diastolic 72 63 75  Pulse 61 70 96     Physical Exam  Constitutional: No distress.  Morbidly obese in no acute distress.  Cardiovascular: Normal rate, regular rhythm, normal heart sounds, intact distal pulses and normal pulses. Exam reveals no gallop.  No murmur heard. Pulses:      Carotid pulses are 2+ on the right side and 2+ on the left side.      Femoral pulses are 2+ on the right side and 2+ on the left side.      Dorsalis pedis pulses are 2+ on  the right side and 2+ on the left side.       Posterior tibial pulses are 2+ on the right side and 2+ on the left side.  No leg edema, no JVD.   Pulmonary/Chest: Effort normal and breath sounds normal. No accessory muscle usage.  Abdominal: Soft. Bowel sounds are normal.  Obese. Pannus present   Laboratory examination:   No results for input(s): NA, K, CL, CO2, GLUCOSE, BUN, CREATININE, CALCIUM, GFRNONAA, GFRAA in the last 8760 hours. CrCl cannot be calculated (Patient's most recent lab result is older than the maximum 21 days allowed.).  CMP Latest Ref Rng & Units 12/22/2010 10/24/2010  Glucose 70 - 99 mg/dL 85 89  BUN 6 - 23 mg/dL 10 13  Creatinine 0.50 - 1.10 mg/dL 0.60 0.55  Sodium 135 - 145 mEq/L 139 135  Potassium 3.5 - 5.1 mEq/L 4.2 4.3  Chloride 96 - 112 mEq/L 105 104  CO2 19 - 32 mEq/L - 24  Calcium 8.4 - 10.5 mg/dL - 8.4   CBC Latest Ref Rng & Units 12/22/2010 12/22/2010 11/02/2010  WBC 4.0 - 10.5 K/uL - 9.7 16.0(H)  Hemoglobin 12.0 - 15.0 g/dL 12.6 11.6(L) 9.2(L)  Hematocrit 36.0 - 46.0 % 37.0 35.0(L) 29.0(L)  Platelets 150 - 400 K/uL - 455(H) 261   Lipid Panel  No results found for: CHOL, TRIG, HDL, CHOLHDL, VLDL, LDLCALC, LDLDIRECT HEMOGLOBIN A1C No results found for: HGBA1C, MPG TSH No results for input(s): TSH in the last 8760 hours.  External labs:   A1C 6.200 10/04/2017  Cholesterol, total 158.000 04/20/2019 Triglycerides 82.000 04/20/2019 HDL 56.000 04/20/2019 LDL-C 87.000 04/20/2019  Glucose Random 104.000 04/20/2019 BUN 21.000 04/20/2019 Creatinine, Serum 0.870 04/20/2019  TSH 3.140 04/20/2019  Medications and allergies   Allergies  Allergen Reactions  . Fish Oil Rash     Current Outpatient Medications  Medication Instructions  . buPROPion (WELLBUTRIN XL) 150 mg, Oral,  Every morning - 10a  . fexofenadine (ALLEGRA) 180 mg, Oral, Daily  . levothyroxine (SYNTHROID) 100 mcg, Oral, Daily  . metoprolol succinate (TOPROL-XL) 100 mg, Oral, Daily, Take with or  immediately following a meal.   . montelukast (SINGULAIR) 10 mg, Oral, Daily  . Multiple Vitamins-Iron (MULTIVITAMIN/IRON PO) 1 tablet, Daily  . omeprazole (PRILOSEC) 20 mg, Oral, Daily  . pravastatin (PRAVACHOL) 40 mg, Oral, Daily  . valsartan-hydrochlorothiazide (DIOVAN-HCT) 320-25 MG tablet 1 tablet, Oral, Daily  . Vitamin D3 5,000 Units, Oral   Radiology:   No results found.  Cardiac Studies:   Treadmill exercise stress test 09/30/2015: Indications: HTN The resting electrocardiogram demonstrated normal sinus rhythm, normal resting conduction, no resting arrhythmias and normal rest repolarization. The stress electrocardiogram was normal. There were no  significant arrhythmias. Patient exercised on Bruce protocol for 6:30 minutes and achieved 95% of Max Predicted HR (Target HR was >85% MPHR) and 7.78 METS. Stress symptoms included fatigue and dyspnea. Normal BP response. Exercise capacity was low. Impression: Normal stress EKG.  EKG  EKG 09/03/2019: Sinus bradycardia at rate of 60 bpm, normal axis, no evidence of ischemia, normal EKG.   No significant change from 09/21/2015, Suspect arm lead reversal, not noted in present EKG.  Assessment     ICD-10-CM   1. Other chest pain  R07.89 EKG 12-Lead    PCV ECHOCARDIOGRAM COMPLETE    PCV MYOCARDIAL PERFUSION WO LEXISCAN  2. Dyspnea on exertion  R06.00 PCV ECHOCARDIOGRAM COMPLETE    PCV MYOCARDIAL PERFUSION WO LEXISCAN  3. Primary hypertension  I10 PCV ECHOCARDIOGRAM COMPLETE    PCV MYOCARDIAL PERFUSION WO LEXISCAN  4. Hypercholesteremia  E78.00 PCV MYOCARDIAL PERFUSION WO LEXISCAN  5. Prediabetes  R73.03 PCV MYOCARDIAL PERFUSION WO LEXISCAN  6. Class 3 severe obesity due to excess calories without serious comorbidity with body mass index (BMI) of 40.0 to 44.9 in adult (HCC)  E66.01    Z68.41   7. Gastroesophageal reflux disease without esophagitis  K21.9      Outpatient Encounter Medications as of 09/03/2019  Medication Sig  .  buPROPion (WELLBUTRIN XL) 150 MG 24 hr tablet Take 150 mg by mouth every morning.  . Cholecalciferol (VITAMIN D3) 3000 units TABS Take 5,000 Units by mouth.  . fexofenadine (ALLEGRA) 180 MG tablet Take 180 mg by mouth daily.  Marland Kitchen levothyroxine (SYNTHROID, LEVOTHROID) 50 MCG tablet Take 100 mcg by mouth daily.   . metoprolol succinate (TOPROL-XL) 100 MG 24 hr tablet Take 100 mg by mouth daily. Take with or immediately following a meal.  . montelukast (SINGULAIR) 10 MG tablet Take 10 mg by mouth daily.  . Multiple Vitamins-Iron (MULTIVITAMIN/IRON PO) Take 1 tablet by mouth daily.    Marland Kitchen omeprazole (PRILOSEC) 20 MG capsule Take 20 mg by mouth daily.  . pravastatin (PRAVACHOL) 40 MG tablet Take 40 mg by mouth daily.  . valsartan-hydrochlorothiazide (DIOVAN-HCT) 320-25 MG tablet Take 1 tablet by mouth daily.  . [DISCONTINUED] AMLODIPINE BESYLATE PO Take 5 mg by mouth.  . [DISCONTINUED] meclizine (ANTIVERT) 12.5 MG tablet Take 1 tablet (12.5 mg total) by mouth 3 (three) times daily as needed for dizziness.  . [DISCONTINUED] meclizine (ANTIVERT) 25 MG tablet Take 25 mg by mouth every 8 (eight) hours.    . [DISCONTINUED] ondansetron (ZOFRAN) 4 MG tablet Take 1 tablet (4 mg total) by mouth every 8 (eight) hours as needed for nausea or vomiting.  . [DISCONTINUED] oxyCODONE-acetaminophen (ROXICET) 5-325 MG tablet Take 1-2 tablets by mouth every 4 (four) hours as needed.   No facility-administered encounter medications on file as of 09/03/2019.    Recommendations:   ASHIA MUSICO  is a 59 y.o. Caucasian female with history of hypertension, hypothyroidism, GERD, prediabetes chronic migraine headaches with aura, obesity, referred to [place] for evaluation of chest pain, patient evaluated by Dr. Collene Mares by GI, being treated for GERD but concerned about chest pain and dyspnea on exertion.  Chest pain symptoms are at most atypical, happening both at rest and during routine activities and appears to suggest more of GERD  but cannot exclude coronary ischemia in view of morbid obesity, prediabetes, hypertension, hyperlipidemia and [place] race as risk factors. Schedule for a Exercise Nuclear stress test to evaluate for myocardial ischemia. Will schedule for an echocardiogram. I will personally perform the test  and if I find abnormalities,  will perform further evaluation. Otherwise unless new on ongoing symptoms(patient advised to contact us), preventive  therapy is recommended. I will then see the patient on a PRN basis.   I have discussed with her regarding her weight, symptoms of GERD have also been worsened by her morbid obesity.  Various weight loss programs discussed with the patient.  Adrian Prows, MD, [facility] 09/03/2019, 12:32 PM [facility] Pager: [phone] Office: [phone]

## 2019-09-03 ENCOUNTER — Ambulatory Visit: Payer: BC Managed Care – PPO | Admitting: Cardiology

## 2019-09-03 ENCOUNTER — Other Ambulatory Visit: Payer: Self-pay

## 2019-09-03 ENCOUNTER — Encounter: Payer: Self-pay | Admitting: Cardiology

## 2019-09-03 VITALS — BP 133/72 | HR 61 | Resp 17 | Ht 59.0 in | Wt 217.0 lb

## 2019-09-03 DIAGNOSIS — K219 Gastro-esophageal reflux disease without esophagitis: Secondary | ICD-10-CM

## 2019-09-03 DIAGNOSIS — Z6841 Body Mass Index (BMI) 40.0 and over, adult: Secondary | ICD-10-CM

## 2019-09-03 DIAGNOSIS — E7800 Pure hypercholesterolemia, unspecified: Secondary | ICD-10-CM

## 2019-09-03 DIAGNOSIS — R0789 Other chest pain: Secondary | ICD-10-CM

## 2019-09-03 DIAGNOSIS — R7303 Prediabetes: Secondary | ICD-10-CM

## 2019-09-03 DIAGNOSIS — R0609 Other forms of dyspnea: Secondary | ICD-10-CM

## 2019-09-03 DIAGNOSIS — I10 Essential (primary) hypertension: Secondary | ICD-10-CM

## 2019-09-09 ENCOUNTER — Ambulatory Visit: Payer: BC Managed Care – PPO

## 2019-09-09 ENCOUNTER — Other Ambulatory Visit: Payer: Self-pay

## 2019-09-09 DIAGNOSIS — R0789 Other chest pain: Secondary | ICD-10-CM

## 2019-09-09 DIAGNOSIS — R0609 Other forms of dyspnea: Secondary | ICD-10-CM

## 2019-09-09 DIAGNOSIS — I10 Essential (primary) hypertension: Secondary | ICD-10-CM

## 2019-09-21 ENCOUNTER — Ambulatory Visit: Payer: BC Managed Care – PPO

## 2019-09-21 ENCOUNTER — Other Ambulatory Visit: Payer: Self-pay

## 2019-09-21 DIAGNOSIS — E7800 Pure hypercholesterolemia, unspecified: Secondary | ICD-10-CM

## 2019-09-21 DIAGNOSIS — R0609 Other forms of dyspnea: Secondary | ICD-10-CM

## 2019-09-21 DIAGNOSIS — R7303 Prediabetes: Secondary | ICD-10-CM

## 2019-09-21 DIAGNOSIS — I10 Essential (primary) hypertension: Secondary | ICD-10-CM

## 2019-09-21 DIAGNOSIS — R0789 Other chest pain: Secondary | ICD-10-CM

## 2019-09-22 NOTE — Progress Notes (Signed)
Low risk stress in view of normal wall motion and normal LVEF.  Test is good, please ask her to continue to work on her diet

## 2021-05-25 DIAGNOSIS — Z20822 Contact with and (suspected) exposure to covid-19: Secondary | ICD-10-CM | POA: Diagnosis not present

## 2021-05-25 DIAGNOSIS — Z03818 Encounter for observation for suspected exposure to other biological agents ruled out: Secondary | ICD-10-CM | POA: Diagnosis not present

## 2021-05-26 DIAGNOSIS — I959 Hypotension, unspecified: Secondary | ICD-10-CM | POA: Diagnosis not present

## 2021-05-26 DIAGNOSIS — J40 Bronchitis, not specified as acute or chronic: Secondary | ICD-10-CM | POA: Diagnosis not present

## 2021-06-23 DIAGNOSIS — I10 Essential (primary) hypertension: Secondary | ICD-10-CM | POA: Diagnosis not present

## 2021-06-23 DIAGNOSIS — E039 Hypothyroidism, unspecified: Secondary | ICD-10-CM | POA: Diagnosis not present

## 2021-06-23 DIAGNOSIS — R7303 Prediabetes: Secondary | ICD-10-CM | POA: Diagnosis not present

## 2021-07-18 DIAGNOSIS — R42 Dizziness and giddiness: Secondary | ICD-10-CM | POA: Diagnosis not present

## 2021-07-18 DIAGNOSIS — J329 Chronic sinusitis, unspecified: Secondary | ICD-10-CM | POA: Diagnosis not present

## 2021-07-18 DIAGNOSIS — F418 Other specified anxiety disorders: Secondary | ICD-10-CM | POA: Diagnosis not present

## 2021-08-02 DIAGNOSIS — I10 Essential (primary) hypertension: Secondary | ICD-10-CM | POA: Diagnosis not present

## 2021-08-02 DIAGNOSIS — E785 Hyperlipidemia, unspecified: Secondary | ICD-10-CM | POA: Diagnosis not present

## 2021-08-02 DIAGNOSIS — Z6841 Body Mass Index (BMI) 40.0 and over, adult: Secondary | ICD-10-CM | POA: Diagnosis not present

## 2021-12-27 ENCOUNTER — Other Ambulatory Visit: Payer: Self-pay | Admitting: Family Medicine

## 2021-12-27 DIAGNOSIS — Z1231 Encounter for screening mammogram for malignant neoplasm of breast: Secondary | ICD-10-CM

## 2022-01-01 ENCOUNTER — Ambulatory Visit
Admission: RE | Admit: 2022-01-01 | Discharge: 2022-01-01 | Disposition: A | Discharge status: Home / Self Care | Payer: BC Managed Care – PPO | Source: Ambulatory Visit | Attending: Family Medicine | Admitting: Family Medicine

## 2022-01-01 DIAGNOSIS — Z1231 Encounter for screening mammogram for malignant neoplasm of breast: Secondary | ICD-10-CM

## 2022-01-10 DIAGNOSIS — I10 Essential (primary) hypertension: Secondary | ICD-10-CM | POA: Diagnosis not present

## 2022-01-10 DIAGNOSIS — R519 Headache, unspecified: Secondary | ICD-10-CM | POA: Diagnosis not present

## 2022-01-10 DIAGNOSIS — E039 Hypothyroidism, unspecified: Secondary | ICD-10-CM | POA: Diagnosis not present

## 2022-01-10 DIAGNOSIS — R7303 Prediabetes: Secondary | ICD-10-CM | POA: Diagnosis not present

## 2022-01-10 DIAGNOSIS — E785 Hyperlipidemia, unspecified: Secondary | ICD-10-CM | POA: Diagnosis not present

## 2022-03-19 IMAGING — NM NM HEPATO W/GB/PHARM/[PERSON_NAME]
2 series · 12 of 12 positions shown · non-contrast
Comparison: None

CLINICAL DATA: Epigastric and RIGHT upper quadrant pain, nausea,
and vomiting for 2 months, nonradiating pain

EXAM:
NUCLEAR MEDICINE HEPATOBILIARY IMAGING WITH GALLBLADDER EF
TECHNIQUE: Sequential images of the abdomen were obtained [DATE] minutes
following intravenous administration of radiopharmaceutical. After
oral ingestion of Ensure, gallbladder ejection fraction was
determined. At 60 min, normal ejection fraction is greater than 33%.
RADIOPHARMACEUTICALS:  5.0 mCi Ec-TTm  Choletec IV

[he hepatobiliary · 4.52mm/px · 6 of 60 frames shown (1 of 2)]
[frame 6/60]
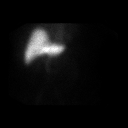
[frame 16/60]
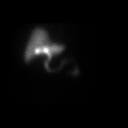
[frame 26/60]
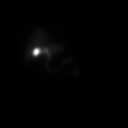
[frame 36/60]
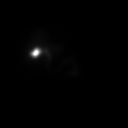
[frame 46/60]
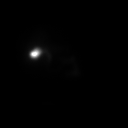
[frame 56/60]
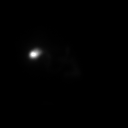

[he hepatobiliary · 4.52mm/px · 6 of 60 frames shown (2 of 2)]
[frame 6/60]
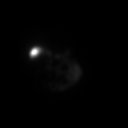
[frame 16/60]
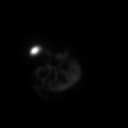
[frame 26/60]
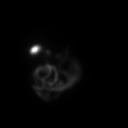
[frame 36/60]
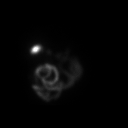
[frame 46/60]
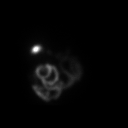
[frame 56/60]
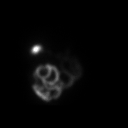

[12 of 12 positions shown; findings below may reference images not displayed]

FINDINGS: Normal tracer extraction from bloodstream indicating normal
hepatocellular function.

Normal excretion of tracer into biliary tree.

Gallbladder visualized at 18 min.

Small bowel visualized at 10 min.

No hepatic retention of tracer.

Subjectively normal emptying of tracer from gallbladder following
fatty meal stimulation.

Calculated gallbladder ejection fraction is 63%, normal.

Patient reported no symptoms following Ensure ingestion.

Normal gallbladder ejection fraction following Ensure ingestion is
greater than 33% at 1 hour.
IMPRESSION: Normal exam.

## 2022-07-03 DIAGNOSIS — R7303 Prediabetes: Secondary | ICD-10-CM | POA: Diagnosis not present

## 2022-07-03 DIAGNOSIS — E039 Hypothyroidism, unspecified: Secondary | ICD-10-CM | POA: Diagnosis not present

## 2022-07-03 DIAGNOSIS — E785 Hyperlipidemia, unspecified: Secondary | ICD-10-CM | POA: Diagnosis not present

## 2022-07-03 DIAGNOSIS — Z6841 Body Mass Index (BMI) 40.0 and over, adult: Secondary | ICD-10-CM | POA: Diagnosis not present

## 2022-07-03 DIAGNOSIS — I10 Essential (primary) hypertension: Secondary | ICD-10-CM | POA: Diagnosis not present

## 2022-11-19 DIAGNOSIS — E039 Hypothyroidism, unspecified: Secondary | ICD-10-CM | POA: Diagnosis not present

## 2022-11-19 DIAGNOSIS — R7303 Prediabetes: Secondary | ICD-10-CM | POA: Diagnosis not present

## 2022-11-19 DIAGNOSIS — E785 Hyperlipidemia, unspecified: Secondary | ICD-10-CM | POA: Diagnosis not present

## 2022-11-19 DIAGNOSIS — R519 Headache, unspecified: Secondary | ICD-10-CM | POA: Diagnosis not present

## 2022-11-19 DIAGNOSIS — I10 Essential (primary) hypertension: Secondary | ICD-10-CM | POA: Diagnosis not present

## 2022-12-10 DIAGNOSIS — Z23 Encounter for immunization: Secondary | ICD-10-CM | POA: Diagnosis not present

## 2022-12-10 DIAGNOSIS — I10 Essential (primary) hypertension: Secondary | ICD-10-CM | POA: Diagnosis not present

## 2022-12-10 DIAGNOSIS — R519 Headache, unspecified: Secondary | ICD-10-CM | POA: Diagnosis not present

## 2022-12-10 DIAGNOSIS — L309 Dermatitis, unspecified: Secondary | ICD-10-CM | POA: Diagnosis not present

## 2022-12-25 ENCOUNTER — Other Ambulatory Visit: Payer: Self-pay | Admitting: Family Medicine

## 2022-12-25 DIAGNOSIS — Z1231 Encounter for screening mammogram for malignant neoplasm of breast: Secondary | ICD-10-CM

## 2023-01-21 DIAGNOSIS — M1711 Unilateral primary osteoarthritis, right knee: Secondary | ICD-10-CM | POA: Diagnosis not present

## 2023-01-28 ENCOUNTER — Ambulatory Visit
Admission: RE | Admit: 2023-01-28 | Discharge: 2023-01-28 | Disposition: A | Discharge status: Home / Self Care | Payer: BC Managed Care – PPO | Source: Ambulatory Visit | Attending: Family Medicine | Admitting: Family Medicine

## 2023-01-28 DIAGNOSIS — Z1231 Encounter for screening mammogram for malignant neoplasm of breast: Secondary | ICD-10-CM

## 2023-03-18 DIAGNOSIS — J0110 Acute frontal sinusitis, unspecified: Secondary | ICD-10-CM | POA: Diagnosis not present

## 2023-03-18 DIAGNOSIS — R52 Pain, unspecified: Secondary | ICD-10-CM | POA: Diagnosis not present

## 2024-01-17 ENCOUNTER — Encounter: Payer: Self-pay | Admitting: Cardiology

## 2024-01-21 ENCOUNTER — Other Ambulatory Visit: Payer: Self-pay | Admitting: Family Medicine

## 2024-01-21 DIAGNOSIS — Z1231 Encounter for screening mammogram for malignant neoplasm of breast: Secondary | ICD-10-CM

## 2024-02-14 ENCOUNTER — Ambulatory Visit
Admission: RE | Admit: 2024-02-14 | Discharge: 2024-02-14 | Disposition: A | Discharge status: Home / Self Care | Source: Ambulatory Visit | Attending: Family Medicine | Admitting: Family Medicine

## 2024-02-14 DIAGNOSIS — Z1231 Encounter for screening mammogram for malignant neoplasm of breast: Secondary | ICD-10-CM

## 2024-08-25 ENCOUNTER — Ambulatory Visit: Admitting: Family Medicine
